# Patient Record
Sex: Female | Born: 1975
Health system: Southern US, Community
[De-identification: ages and names within clinical notes are randomized; demographics above are authoritative.]

## PROBLEM LIST (undated history)

## (undated) ENCOUNTER — Inpatient Hospital Stay (HOSPITAL_COMMUNITY): Payer: Self-pay

## (undated) DIAGNOSIS — N810 Urethrocele: Secondary | ICD-10-CM

## (undated) DIAGNOSIS — D649 Anemia, unspecified: Secondary | ICD-10-CM

## (undated) HISTORY — DX: Anemia, unspecified: D64.9

## (undated) HISTORY — DX: Urethrocele: N81.0

---

## 2007-04-17 ENCOUNTER — Inpatient Hospital Stay (HOSPITAL_COMMUNITY): Admission: AD | Admit: 2007-04-17 | Discharge: 2007-04-17 | Payer: Self-pay | Admitting: Obstetrics & Gynecology

## 2007-07-15 ENCOUNTER — Ambulatory Visit (HOSPITAL_COMMUNITY): Admission: RE | Admit: 2007-07-15 | Discharge: 2007-07-15 | Payer: Self-pay | Admitting: Obstetrics and Gynecology

## 2007-09-23 ENCOUNTER — Inpatient Hospital Stay (HOSPITAL_COMMUNITY): Admission: AD | Admit: 2007-09-23 | Discharge: 2007-09-23 | Payer: Self-pay | Admitting: Specialist

## 2007-10-26 ENCOUNTER — Inpatient Hospital Stay (HOSPITAL_COMMUNITY): Admission: AD | Admit: 2007-10-26 | Discharge: 2007-10-26 | Payer: Self-pay | Admitting: Obstetrics and Gynecology

## 2007-11-29 ENCOUNTER — Inpatient Hospital Stay (HOSPITAL_COMMUNITY): Admission: AD | Admit: 2007-11-29 | Discharge: 2007-12-01 | Payer: Self-pay | Admitting: Obstetrics and Gynecology

## 2008-05-26 ENCOUNTER — Emergency Department (HOSPITAL_COMMUNITY): Admission: EM | Admit: 2008-05-26 | Discharge: 2008-05-26 | Payer: Self-pay | Admitting: Emergency Medicine

## 2008-12-06 ENCOUNTER — Inpatient Hospital Stay (HOSPITAL_COMMUNITY): Admission: AD | Admit: 2008-12-06 | Discharge: 2008-12-06 | Payer: Self-pay | Admitting: Specialist

## 2008-12-18 ENCOUNTER — Inpatient Hospital Stay (HOSPITAL_COMMUNITY): Admission: AD | Admit: 2008-12-18 | Discharge: 2008-12-18 | Payer: Self-pay | Admitting: Obstetrics and Gynecology

## 2009-01-04 ENCOUNTER — Emergency Department (HOSPITAL_COMMUNITY): Admission: EM | Admit: 2009-01-04 | Discharge: 2009-01-04 | Payer: Self-pay | Admitting: Emergency Medicine

## 2009-04-21 ENCOUNTER — Inpatient Hospital Stay (HOSPITAL_COMMUNITY): Admission: AD | Admit: 2009-04-21 | Discharge: 2009-04-22 | Payer: Self-pay | Admitting: Obstetrics and Gynecology

## 2009-04-21 ENCOUNTER — Ambulatory Visit: Payer: Self-pay | Admitting: Physician Assistant

## 2009-06-29 ENCOUNTER — Inpatient Hospital Stay (HOSPITAL_COMMUNITY): Admission: AD | Admit: 2009-06-29 | Discharge: 2009-06-29 | Payer: Self-pay | Admitting: Obstetrics and Gynecology

## 2009-07-15 ENCOUNTER — Inpatient Hospital Stay (HOSPITAL_COMMUNITY): Admission: AD | Admit: 2009-07-15 | Discharge: 2009-07-18 | Payer: Self-pay | Admitting: Obstetrics and Gynecology

## 2009-07-21 ENCOUNTER — Inpatient Hospital Stay (HOSPITAL_COMMUNITY): Admission: AD | Admit: 2009-07-21 | Discharge: 2009-07-21 | Payer: Self-pay | Admitting: Obstetrics & Gynecology

## 2009-08-01 ENCOUNTER — Emergency Department (HOSPITAL_COMMUNITY): Admission: EM | Admit: 2009-08-01 | Discharge: 2009-08-01 | Payer: Self-pay | Admitting: Emergency Medicine

## 2009-10-09 ENCOUNTER — Emergency Department (HOSPITAL_BASED_OUTPATIENT_CLINIC_OR_DEPARTMENT_OTHER): Admission: EM | Admit: 2009-10-09 | Discharge: 2009-10-09 | Payer: Self-pay | Admitting: Emergency Medicine

## 2009-11-19 ENCOUNTER — Encounter: Admission: RE | Admit: 2009-11-19 | Discharge: 2009-11-19 | Payer: Self-pay | Admitting: Infectious Diseases

## 2010-06-16 LAB — CBC
HCT: 30.6 % — ABNORMAL LOW (ref 36.0–46.0)
MCHC: 31.7 g/dL (ref 30.0–36.0)
RBC: 4.18 MIL/uL (ref 3.87–5.11)
WBC: 9.6 10*3/uL (ref 4.0–10.5)

## 2010-06-16 LAB — STREP A DNA PROBE: Group A Strep Probe: NEGATIVE

## 2010-06-18 LAB — CBC
HCT: 31.2 % — ABNORMAL LOW (ref 36.0–46.0)
HCT: 33.1 % — ABNORMAL LOW (ref 36.0–46.0)
HCT: 33.4 % — ABNORMAL LOW (ref 36.0–46.0)
Hemoglobin: 10.5 g/dL — ABNORMAL LOW (ref 12.0–15.0)
Hemoglobin: 8.9 g/dL — ABNORMAL LOW (ref 12.0–15.0)
MCHC: 31.6 g/dL (ref 30.0–36.0)
MCHC: 31.6 g/dL (ref 30.0–36.0)
MCHC: 31.7 g/dL (ref 30.0–36.0)
MCHC: 31.8 g/dL (ref 30.0–36.0)
MCHC: 32.5 g/dL (ref 30.0–36.0)
MCV: 73.1 fL — ABNORMAL LOW (ref 78.0–100.0)
MCV: 73.3 fL — ABNORMAL LOW (ref 78.0–100.0)
MCV: 73.4 fL — ABNORMAL LOW (ref 78.0–100.0)
MCV: 73.8 fL — ABNORMAL LOW (ref 78.0–100.0)
MCV: 73.9 fL — ABNORMAL LOW (ref 78.0–100.0)
Platelets: 150 10*3/uL (ref 150–400)
Platelets: 87 10*3/uL — ABNORMAL LOW (ref 150–400)
Platelets: 89 10*3/uL — ABNORMAL LOW (ref 150–400)
RBC: 3.75 MIL/uL — ABNORMAL LOW (ref 3.87–5.11)
RBC: 4.22 MIL/uL (ref 3.87–5.11)
RDW: 13.5 % (ref 11.5–15.5)
RDW: 13.6 % (ref 11.5–15.5)
WBC: 14.2 10*3/uL — ABNORMAL HIGH (ref 4.0–10.5)
WBC: 8.5 10*3/uL (ref 4.0–10.5)
WBC: 9.9 10*3/uL (ref 4.0–10.5)

## 2010-06-18 LAB — COMPREHENSIVE METABOLIC PANEL
Alkaline Phosphatase: 102 U/L (ref 39–117)
Calcium: 8.9 mg/dL (ref 8.4–10.5)
Chloride: 109 mEq/L (ref 96–112)
Creatinine, Ser: 0.58 mg/dL (ref 0.4–1.2)
GFR calc non Af Amer: >60 mL/min (ref >60)
Potassium: 3.4 mEq/L — ABNORMAL LOW (ref 3.5–5.1)
Sodium: 140 mEq/L (ref 135–145)

## 2010-06-18 LAB — URINALYSIS, ROUTINE W REFLEX MICROSCOPIC
Ketones, ur: NEGATIVE mg/dL
Leukocytes, UA: NEGATIVE
Nitrite: NEGATIVE
Protein, ur: NEGATIVE mg/dL
Specific Gravity, Urine: <1.005 — ABNORMAL LOW (ref 1.005–1.030)
Urobilinogen, UA: 0.2 mg/dL (ref 0.0–1.0)

## 2010-06-18 LAB — URINE MICROSCOPIC-ADD ON

## 2010-06-18 LAB — URIC ACID: Uric Acid, Serum: 4.7 mg/dL (ref 2.4–7.0)

## 2010-07-05 LAB — COMPREHENSIVE METABOLIC PANEL
Albumin: 3.6 g/dL (ref 3.5–5.2)
BUN: 4 mg/dL — ABNORMAL LOW (ref 6–23)
CO2: 24 mEq/L (ref 19–32)
Chloride: 104 mEq/L (ref 96–112)
Creatinine, Ser: 0.62 mg/dL (ref 0.4–1.2)
GFR calc non Af Amer: >60 mL/min (ref >60)
Glucose, Bld: 99 mg/dL (ref 70–99)
Total Bilirubin: 0.5 mg/dL (ref 0.3–1.2)

## 2010-07-05 LAB — URINALYSIS, ROUTINE W REFLEX MICROSCOPIC
Glucose, UA: NEGATIVE mg/dL
Hgb urine dipstick: NEGATIVE
Urobilinogen, UA: 0.2 mg/dL (ref 0.0–1.0)
pH: 6.5 (ref 5.0–8.0)

## 2010-07-05 LAB — CBC
HCT: 36.4 % (ref 36.0–46.0)
MCV: 70 fL — ABNORMAL LOW (ref 78.0–100.0)
Platelets: 191 10*3/uL (ref 150–400)
WBC: 11.9 10*3/uL — ABNORMAL HIGH (ref 4.0–10.5)

## 2010-07-05 LAB — POCT PREGNANCY, URINE: Preg Test, Ur: POSITIVE

## 2010-07-05 LAB — URINE MICROSCOPIC-ADD ON

## 2010-07-16 LAB — RAPID STREP SCREEN (MED CTR MEBANE ONLY): Streptococcus, Group A Screen (Direct): POSITIVE — AB

## 2010-08-13 NOTE — H&P (Signed)
Stacy Olson, Stacy Olson            ACCOUNT NO.:  [id number]   MEDICAL RECORD NO.:  [id number]          PATIENT TYPE:  INP   LOCATION:  9175                          FACILITY:  [facility]   PHYSICIAN:  Duke Salvia. Marcelle Overlie, M.D.DATE OF BIRTH:  07-12-1975   DATE OF ADMISSION:  11/29/2007  DATE OF DISCHARGE:                              HISTORY & PHYSICAL   CHIEF COMPLAINT:  For induction of labor at term, audible deceleration  noted in the office.   HISTORY OF PRESENT ILLNESS:  A 35 year old G1, P0 was seen in the office  today with ultrasound showing AFI 27th percentile, EFW 7 pounds, 9  ounces, BPP 8/8, NST was reactive, but there was an audible variable  deceleration when was seen by Marcelino Duster L. Vincente Poli, M.D. who admits her  now for labor induction at term.   This patient's prenatal course has been eventful for positive IgA ACA  when she had developed a marginal abruption early in pregnancy that was  minimal and resolved on its own.  She has been followed by weekly NSTs  which have been reactive.  At one point she was scheduled for CS for  breech, but had spontaneous reversion.   GBS was negative, blood type is B+.  Rubella titer is immune.   PAST MEDICAL HISTORY:  Please see the [facility] form for the details of  her PMH.   PHYSICAL EXAM:  Temp 98.2, blood pressure 104/74.  HEENT: Unremarkable.  NECK:  Supple without mass.  LUNGS:  Clear.  CARDIOVASCULAR:  Regular rate and rhythm without murmurs, rubs, or  gallops.  BREASTS:  Not examined.  Term fundal height.  Fetal heart rate 140. Cervix is closed.  EXTREMITIES/NEUROLOGIC EXAM:  Unremarkable.   IMPRESSION:  1. Term intrauterine pregnancy, history of minimal abruption at [redacted]      weeks gestation with a weak positive IgA ACA.   PLAN:  Two-stage induction discussed with the patient.      Richard M. Marcelle Overlie, M.D.  Electronically Signed     RMH/MEDQ  D:  11/29/2007  T:  11/29/2007  Job:  [id number]

## 2010-08-13 NOTE — H&P (Signed)
NAMEJENIN, Stacy Olson            ACCOUNT NO.:  [id number]   MEDICAL RECORD NO.:  [id number]          PATIENT TYPE:  INP   LOCATION:  NA                            FACILITY:  [facility]   PHYSICIAN:  Dineen Kid. Rana Snare, M.D.    DATE OF BIRTH:  09-Mar-1976   DATE OF ADMISSION:  11/19/2007  DATE OF DISCHARGE:                              HISTORY & PHYSICAL   She is going to be admitted to [facility] on the morning of November 19, 2007 through Day Surgery.   HISTORY OF PRESENT ILLNESS:  Ms. Stacy Olson is a 35 year old G1 at 76 weeks'  gestational age who presents for a primary cesarean section.  Her  pregnancy has been complicated by partial abruption and also positive  IgA anticardiolipin antibody and breech presentation, also early labor  type of symptoms.  She presents for primary cesarean section due to  breech presentation.  Her GBS is negative.  EDC is November 29, 2007.   PAST MEDICAL HISTORY:  Negative.   PAST SURGICAL HISTORY:  Negative.   MEDICATIONS:  Prenatal vitamins.   She has no known drug allergies.   LABORATORY EVALUATION:  Her blood type is B+, HIV negative, hepatitis B  negative, VDRL  nonreactive.   PHYSICAL EXAM:  Her blood pressure is 108/70.  HEART:  Regular rate and rhythm.  LUNGS: Clear to auscultation bilaterally.  ABDOMEN:  Gravid, nontender.  Cervix is closed, thick and high, but soft.  Ultrasound confirms breech  presentation.  Normal AFI.  Biophysical 8/8.   IMPRESSION AND PLAN:  Intrauterine pregnancy at 39 weeks, breech  presentation, early labor type of symptoms with contractions.  Also,  positive anticardiolipin antibody and history of partial abruption early  this pregnancy at 22 weeks.  Recommend primary cesarean section.  The  risks and benefits of the procedure were discussed at length and  informed consent was obtained.      Dineen Kid Rana Snare, M.D.  Electronically Signed     DCL/MEDQ  D:  11/18/2007  T:  11/18/2007  Job:  [id number]

## 2010-10-15 ENCOUNTER — Encounter (HOSPITAL_COMMUNITY): Payer: Self-pay

## 2010-10-15 ENCOUNTER — Inpatient Hospital Stay (HOSPITAL_COMMUNITY)
Admission: AD | Admit: 2010-10-15 | Discharge: 2010-10-15 | Disposition: A | Discharge status: Home / Self Care | Payer: Self-pay | Source: Ambulatory Visit | Attending: Obstetrics & Gynecology | Admitting: Obstetrics & Gynecology

## 2010-10-15 DIAGNOSIS — R300 Dysuria: Secondary | ICD-10-CM | POA: Insufficient documentation

## 2010-10-15 DIAGNOSIS — N390 Urinary tract infection, site not specified: Secondary | ICD-10-CM | POA: Insufficient documentation

## 2010-10-15 DIAGNOSIS — N810 Urethrocele: Secondary | ICD-10-CM

## 2010-10-15 LAB — URINALYSIS, ROUTINE W REFLEX MICROSCOPIC
Leukocytes, UA: NEGATIVE
Nitrite: POSITIVE — AB
Protein, ur: NEGATIVE mg/dL
Urobilinogen, UA: 0.2 mg/dL (ref 0.0–1.0)

## 2010-10-15 LAB — URINE MICROSCOPIC-ADD ON

## 2010-10-15 MED ORDER — HYDROCODONE-ACETAMINOPHEN 5-500 MG PO TABS: 1.0000 | ORAL_TABLET | Freq: Four times a day (QID) | ORAL | Status: AC | PRN

## 2010-10-15 MED ORDER — CIPROFLOXACIN HCL 500 MG PO TABS: 500.0000 mg | ORAL_TABLET | Freq: Two times a day (BID) | ORAL | Status: AC

## 2010-10-15 NOTE — Progress Notes (Signed)
urethracyle noted in vaginal vault. Explained to pt by E.Rice,PA

## 2010-10-15 NOTE — Progress Notes (Signed)
Pt states she has had vagina pain since Sunday. No bleeding or d/c

## 2010-10-15 NOTE — ED Provider Notes (Signed)
History    Patient is a 35 year old black female gravida 2 para 2. She presents today complaining of pain with urination. She states the pain started on Friday of last week. She also thinks she can feel any mass. She denies fever, vaginal discharge, vaginal bleeding, or abdominal pain. She has other questions or problems at this time.  Chief Complaint  Patient presents with  . Vaginal Pain   HPI  OB History    Grav Para Term Preterm Abortions TAB SAB Ect Mult Living   2 2 2       2       No past medical history on file.  Past Surgical History  Procedure Date  . No past surgeries     No family history on file.  History  Substance Use Topics  . Smoking status: Never Smoker   . Smokeless tobacco: Not on file  . Alcohol Use: No    Allergies: No Known Allergies  No prescriptions prior to admission    Review of Systems  Constitutional: Negative for fever and chills.  Cardiovascular: Negative for chest pain and palpitations.  Gastrointestinal: Positive for nausea. Negative for vomiting, abdominal pain, diarrhea and constipation.  Genitourinary: Positive for dysuria. Negative for urgency, frequency, hematuria and flank pain.  Neurological: Negative for headaches.  Psychiatric/Behavioral: Negative for depression and suicidal ideas.   Physical Exam   Blood pressure 110/66, pulse 84, temperature 98.7 F (37.1 C), temperature source Oral, resp. rate 16, height 5\' 6"  (1.676 m), weight 168 lb 6.4 oz (76.386 kg), last menstrual period 09/29/2010, SpO2 99.00%.  Physical Exam  Constitutional: She appears well-developed and well-nourished. No distress.  HENT:  Head: Normocephalic and atraumatic.  Eyes: EOM are normal. Pupils are equal, round, and reactive to light.  GI: She exhibits no distension. There is no tenderness. There is no rebound and no guarding.  Genitourinary:     Skin: She is not diaphoretic.    MAU Course  Procedures  Results for orders placed during the  hospital encounter of 10/15/10 (from the past 24 hour(s))  URINALYSIS, ROUTINE W REFLEX MICROSCOPIC     Status: Abnormal   Collection Time   10/15/10 10:48 AM      Component Value Range   Color, Urine YELLOW  YELLOW    Appearance CLEAR  CLEAR    Specific Gravity, Urine 1.025  1.005 - 1.030    pH 6.0  5.0 - 8.0    Glucose, UA NEGATIVE  NEGATIVE (mg/dL)   Hgb urine dipstick TRACE (*) NEGATIVE    Bilirubin Urine NEGATIVE  NEGATIVE    Ketones, ur NEGATIVE  NEGATIVE (mg/dL)   Protein, ur NEGATIVE  NEGATIVE (mg/dL)   Urobilinogen, UA 0.2  0.0 - 1.0 (mg/dL)   Nitrite POSITIVE (*) NEGATIVE    Leukocytes, UA NEGATIVE  NEGATIVE   URINE MICROSCOPIC-ADD ON     Status: Abnormal   Collection Time   10/15/10 10:48 AM      Component Value Range   Squamous Epithelial / LPF MANY (*) RARE    RBC / HPF 3-6  <3 (RBC/hpf)   Bacteria, UA MANY (*) RARE      Assessment and Plan  1) Urethral mass/UTI: urine sent for culture. I did discuss this pt with Dr. Isabel Caprice (urologist). He advised that she be started on antibiotics and f/u with Dr. Jacquelyne Balint in his office. She will be given an Rx for Cipro and lortab. Discussed diet, activity, risks, and precautions. She will f/u with urology  in the morning.  Clinton Gallant. Rice III, DrHSc, MPAS, PA-C  10/15/2010, 11:13 AM

## 2010-10-17 LAB — URINE CULTURE: Colony Count: >=100000

## 2010-12-11 ENCOUNTER — Other Ambulatory Visit (HOSPITAL_COMMUNITY): Payer: Self-pay | Admitting: Urology

## 2010-12-11 DIAGNOSIS — N361 Urethral diverticulum: Secondary | ICD-10-CM

## 2010-12-16 ENCOUNTER — Ambulatory Visit (HOSPITAL_COMMUNITY)
Admission: RE | Admit: 2010-12-16 | Discharge: 2010-12-16 | Disposition: A | Discharge status: Home / Self Care | Payer: 59 | Source: Ambulatory Visit | Attending: Urology | Admitting: Urology

## 2010-12-16 DIAGNOSIS — N2889 Other specified disorders of kidney and ureter: Secondary | ICD-10-CM | POA: Insufficient documentation

## 2010-12-16 DIAGNOSIS — N361 Urethral diverticulum: Secondary | ICD-10-CM

## 2010-12-16 DIAGNOSIS — N831 Corpus luteum cyst of ovary, unspecified side: Secondary | ICD-10-CM | POA: Insufficient documentation

## 2010-12-16 MED ORDER — GADOBENATE DIMEGLUMINE 529 MG/ML IV SOLN
15.0000 mL | Freq: Once | INTRAVENOUS | Status: AC | PRN
  Administered 2010-12-16: 15 mL via INTRAVENOUS

## 2010-12-20 LAB — URINALYSIS, ROUTINE W REFLEX MICROSCOPIC
Ketones, ur: NEGATIVE
Protein, ur: NEGATIVE
Urobilinogen, UA: 0.2

## 2010-12-20 LAB — CBC
HCT: 30.5 — ABNORMAL LOW
Hemoglobin: 9.9 — ABNORMAL LOW
Platelets: 175
WBC: 6.9

## 2010-12-20 LAB — WET PREP, GENITAL
Clue Cells Wet Prep HPF POC: NONE SEEN
Yeast Wet Prep HPF POC: NONE SEEN

## 2010-12-20 LAB — POCT PREGNANCY, URINE: Operator id: 28886

## 2010-12-26 LAB — COMPREHENSIVE METABOLIC PANEL
ALT: 25
Alkaline Phosphatase: 79
BUN: 4 — ABNORMAL LOW
CO2: 22
Chloride: 105
GFR calc non Af Amer: >60
Glucose, Bld: 93
Potassium: 3.2 — ABNORMAL LOW
Sodium: 134 — ABNORMAL LOW
Total Bilirubin: 0.4
Total Protein: 6.3

## 2010-12-26 LAB — CBC
HCT: 31.8 — ABNORMAL LOW
Hemoglobin: 10.2 — ABNORMAL LOW
RBC: 4.5
RDW: 15.1

## 2010-12-26 LAB — URIC ACID: Uric Acid, Serum: 3.4

## 2010-12-26 LAB — LACTATE DEHYDROGENASE: LDH: 101

## 2011-01-01 LAB — CBC
Hemoglobin: 7.5 — CL
MCHC: 31.5
MCHC: 31.8
Platelets: 86 — ABNORMAL LOW
RBC: 3.41 — ABNORMAL LOW
RDW: 15.2
WBC: 20.7 — ABNORMAL HIGH

## 2011-04-08 ENCOUNTER — Encounter (HOSPITAL_COMMUNITY): Admission: RE | Payer: Self-pay | Source: Ambulatory Visit

## 2011-04-08 ENCOUNTER — Ambulatory Visit (HOSPITAL_COMMUNITY): Admission: RE | Admit: 2011-04-08 | Payer: Managed Care, Other (non HMO) | Source: Ambulatory Visit | Admitting: Urology

## 2011-04-08 SURGERY — URETHROPLASTY, FOR DIVERTICULUM
Anesthesia: General
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
## 2011-05-12 ENCOUNTER — Encounter (HOSPITAL_COMMUNITY): Payer: Self-pay | Admitting: *Deleted

## 2011-05-12 ENCOUNTER — Inpatient Hospital Stay (HOSPITAL_COMMUNITY)
Admission: AD | Admit: 2011-05-12 | Discharge: 2011-05-12 | Disposition: A | Discharge status: Home / Self Care | Payer: Medicaid Other | Source: Ambulatory Visit | Attending: Obstetrics and Gynecology | Admitting: Obstetrics and Gynecology

## 2011-05-12 DIAGNOSIS — O219 Vomiting of pregnancy, unspecified: Secondary | ICD-10-CM

## 2011-05-12 DIAGNOSIS — O210 Mild hyperemesis gravidarum: Secondary | ICD-10-CM | POA: Insufficient documentation

## 2011-05-12 LAB — URINE MICROSCOPIC-ADD ON

## 2011-05-12 LAB — URINALYSIS, ROUTINE W REFLEX MICROSCOPIC
Bilirubin Urine: NEGATIVE
Glucose, UA: NEGATIVE mg/dL
Ketones, ur: 15 mg/dL — AB
Leukocytes, UA: NEGATIVE
pH: 5.5 (ref 5.0–8.0)

## 2011-05-12 MED ORDER — SODIUM CHLORIDE 0.9 % IJ SOLN
INTRAMUSCULAR | Status: AC
  Filled 2011-05-12: qty 3

## 2011-05-12 MED ORDER — PRENATAL RX 60-1 MG PO TABS: 1.0000 | ORAL_TABLET | Freq: Every day | ORAL | Status: DC

## 2011-05-12 MED ORDER — LACTATED RINGERS IV SOLN
Freq: Once | INTRAVENOUS | Status: AC
  Administered 2011-05-12: 03:00:00 via INTRAVENOUS

## 2011-05-12 MED ORDER — SODIUM CHLORIDE 0.9 % IV SOLN
25.0000 mg | Freq: Once | INTRAVENOUS | Status: AC
  Administered 2011-05-12: 25 mg via INTRAVENOUS
  Filled 2011-05-12: qty 1

## 2011-05-12 MED ORDER — ONDANSETRON 8 MG PO TBDP: 8.0000 mg | ORAL_TABLET | Freq: Three times a day (TID) | ORAL | Status: AC | PRN

## 2011-05-12 NOTE — Progress Notes (Signed)
Pt states for the last 7 days she has had a lot of nausea and vomiting-sttes it is related to the pregnancy

## 2011-05-12 NOTE — Progress Notes (Signed)
Pt c/o numerous episodes vomiting x 2 days. No diarrhea. Mild epigastric pain.

## 2011-05-12 NOTE — Progress Notes (Signed)
Pt able to eat crackers and clear liquids without vomiting.

## 2011-05-12 NOTE — ED Provider Notes (Signed)
History     Chief Complaint  Patient presents with  . Morning Sickness   HPI This is a 36 y.o. female who presents with complaint of "morning sickness" which has worsened over the past two days. Was able to keep down Coke until today. Has seen Dr Arelia Sneddon in past pregnancies, but has not called them yet for this pregnancy because she did not have her Medicaid yet. Denies fever or other symptoms except a bit of epigastric pain.   OB History    Grav Para Term Preterm Abortions TAB SAB Ect Mult Living   3 2 2       2       History reviewed. No pertinent past medical history.  Past Surgical History  Procedure Date  . No past surgeries     History reviewed. No pertinent family history.  History  Substance Use Topics  . Smoking status: Never Smoker   . Smokeless tobacco: Not on file  . Alcohol Use: No    Allergies: No Known Allergies  No prescriptions prior to admission    ROS As above  Physical Exam   Blood pressure 121/71, pulse 83, temperature 97.8 F (36.6 C), temperature source Oral, resp. rate 20, height 5\' 6"  (1.676 m), weight 166 lb (75.297 kg), last menstrual period 03/01/2011, SpO2 100.00%.  Physical Exam  Constitutional: She is oriented to person, place, and time. She appears well-developed. No distress.  HENT:  Head: Normocephalic.  Cardiovascular: Normal rate.   Respiratory: Effort normal.  GI: Soft. She exhibits no distension and no mass. There is no tenderness. There is no rebound and no guarding.  Musculoskeletal: Normal range of motion.  Neurological: She is alert and oriented to person, place, and time.  Skin: Skin is warm and dry.  Psychiatric: She has a normal mood and affect.    MAU Course  Procedures  Assessment and Plan  A:  Pregnancy at 10.2 weeks by LMP      Nausea and vomiting of pregnancy P:  Phenergan via IV infusion for one liter  [facility] 05/12/2011, 1:14 AM   Feels better after second liter of fluid Will d/c home with  Zofran. Pt to follow up with [facility]

## 2011-05-30 ENCOUNTER — Encounter (HOSPITAL_COMMUNITY): Payer: Self-pay | Admitting: *Deleted

## 2011-05-30 ENCOUNTER — Inpatient Hospital Stay (HOSPITAL_COMMUNITY)
Admission: AD | Admit: 2011-05-30 | Discharge: 2011-05-31 | Disposition: A | Discharge status: Home / Self Care | Payer: Managed Care, Other (non HMO) | Source: Ambulatory Visit | Attending: Obstetrics and Gynecology | Admitting: Obstetrics and Gynecology

## 2011-05-30 DIAGNOSIS — O219 Vomiting of pregnancy, unspecified: Secondary | ICD-10-CM

## 2011-05-30 DIAGNOSIS — R1084 Generalized abdominal pain: Secondary | ICD-10-CM

## 2011-05-30 DIAGNOSIS — O210 Mild hyperemesis gravidarum: Secondary | ICD-10-CM | POA: Insufficient documentation

## 2011-05-30 LAB — URINALYSIS, ROUTINE W REFLEX MICROSCOPIC
Nitrite: NEGATIVE
Protein, ur: NEGATIVE mg/dL
Urobilinogen, UA: 0.2 mg/dL (ref 0.0–1.0)

## 2011-05-30 LAB — URINE MICROSCOPIC-ADD ON

## 2011-05-30 MED ORDER — LACTATED RINGERS IV BOLUS (SEPSIS)
1000.0000 mL | Freq: Once | INTRAVENOUS | Status: AC
  Administered 2011-05-31: 1000 mL via INTRAVENOUS

## 2011-05-30 MED ORDER — PROMETHAZINE HCL 25 MG/ML IJ SOLN
12.5000 mg | Freq: Once | INTRAMUSCULAR | Status: AC
  Administered 2011-05-31: 12.5 mg via INTRAVENOUS
  Filled 2011-05-30: qty 1

## 2011-05-30 NOTE — ED Provider Notes (Signed)
History     Chief Complaint  Patient presents with  . Morning Sickness  . Abdominal Pain   HPI 36 y.o. G3P2002 at [redacted]w[redacted]d with n/v since onset of pregnancy, taking Zofran, not helping, not able to keep anything down x 2 days. Also c/o low abd pain and low back pain x 4-5 days.    History reviewed. No pertinent past medical history.  Past Surgical History  Procedure Date  . No past surgeries     Family History  Problem Relation Age of Onset  . Anesthesia problems Neg Hx   . Hypotension Neg Hx   . Malignant hyperthermia Neg Hx   . Pseudochol deficiency Neg Hx     History  Substance Use Topics  . Smoking status: Never Smoker   . Smokeless tobacco: Not on file  . Alcohol Use: No    Allergies: No Known Allergies  Prescriptions prior to admission  Medication Sig Dispense Refill  . ferrous sulfate 325 (65 FE) MG tablet Take 325 mg by mouth as needed. Patient states that she takes iron tablet as a supplement.      . ondansetron (ZOFRAN-ODT) 4 MG disintegrating tablet Take 4 mg by mouth every 8 (eight) hours as needed. For nausea      . Prenatal Vit-Fe Fumarate-FA (PRENATAL MULTIVITAMIN) 60-1 MG tablet Take 1 tablet by mouth daily.  30 tablet  0    Review of Systems  Constitutional: Negative.   Respiratory: Negative.   Cardiovascular: Negative.   Gastrointestinal: Positive for nausea, vomiting and abdominal pain. Negative for diarrhea and constipation.  Genitourinary: Negative for dysuria, urgency, frequency, hematuria and flank pain.       Negative for vaginal bleeding, + vaginal discharge  Musculoskeletal: Negative.   Neurological: Negative.   Psychiatric/Behavioral: Negative.    Physical Exam   Blood pressure 99/77, pulse 95, temperature 98.9 F (37.2 C), temperature source Oral, resp. rate 16, height 5\' 6"  (1.676 m), weight 165 lb 8 oz (75.07 kg), last menstrual period 03/19/2011.  Physical Exam  Nursing note and vitals reviewed. Constitutional: She is oriented  to person, place, and time. She appears well-developed and well-nourished. No distress.  Cardiovascular: Normal rate.   Respiratory: Effort normal.  GI: Soft. She exhibits no distension and no mass. There is no tenderness. There is no rebound and no guarding.  Musculoskeletal: Normal range of motion.  Neurological: She is alert and oriented to person, place, and time.  Skin: Skin is warm and dry.  Psychiatric: She has a normal mood and affect.    MAU Course  Procedures  Beside u/s for viability only: + FHR, 150  IV hydration and Phenergan IV - feeling better, tolerating PO fluids and peanut butter crackers  Assessment and Plan  35 y.o. [id number] at [redacted]w[redacted]d N/V of pregnancy - rx phenergan 25 mg PO q 4-6 hours PRN F/U as scheduled  FRAZIER,NATALIE 05/31/2011, 12:07 AM

## 2011-05-30 NOTE — Progress Notes (Signed)
Georges Mouse CNM in with pt. Portable u/s brought in and fetus active with fhr about 150.

## 2011-05-30 NOTE — Progress Notes (Signed)
Pt states, " I have been vomiting since yesterday and I've taken zofran but it is not helping. I also have pain in my lower belly since yesterday."

## 2011-05-31 MED ORDER — PROMETHAZINE HCL 25 MG PO TABS: 25.0000 mg | ORAL_TABLET | Freq: Four times a day (QID) | ORAL | Status: DC | PRN

## 2011-05-31 NOTE — Progress Notes (Signed)
Written and verbal d/c instructions given and understanding voiced. 

## 2011-06-05 LAB — OB RESULTS CONSOLE HEPATITIS B SURFACE ANTIGEN: Hepatitis B Surface Ag: NEGATIVE

## 2011-06-05 LAB — OB RESULTS CONSOLE ABO/RH: RH Type: POSITIVE

## 2011-06-24 ENCOUNTER — Other Ambulatory Visit: Payer: Self-pay

## 2011-07-15 ENCOUNTER — Other Ambulatory Visit: Payer: Self-pay

## 2011-07-16 ENCOUNTER — Other Ambulatory Visit (HOSPITAL_COMMUNITY): Payer: Self-pay | Admitting: Obstetrics and Gynecology

## 2011-07-16 DIAGNOSIS — O359XX0 Maternal care for (suspected) fetal abnormality and damage, unspecified, not applicable or unspecified: Secondary | ICD-10-CM

## 2011-07-18 ENCOUNTER — Ambulatory Visit (HOSPITAL_COMMUNITY): Payer: Managed Care, Other (non HMO)

## 2011-07-18 ENCOUNTER — Ambulatory Visit (HOSPITAL_COMMUNITY)
Admission: RE | Admit: 2011-07-18 | Discharge: 2011-07-18 | Disposition: A | Discharge status: Home / Self Care | Payer: Medicaid Other | Source: Ambulatory Visit | Attending: Obstetrics and Gynecology | Admitting: Obstetrics and Gynecology

## 2011-07-18 ENCOUNTER — Encounter (HOSPITAL_COMMUNITY): Payer: Self-pay

## 2011-07-18 VITALS — BP 98/66 | HR 97 | Wt 167.0 lb

## 2011-07-18 DIAGNOSIS — O358XX0 Maternal care for other (suspected) fetal abnormality and damage, not applicable or unspecified: Secondary | ICD-10-CM | POA: Insufficient documentation

## 2011-07-18 DIAGNOSIS — O09529 Supervision of elderly multigravida, unspecified trimester: Secondary | ICD-10-CM | POA: Insufficient documentation

## 2011-07-18 DIAGNOSIS — O359XX0 Maternal care for (suspected) fetal abnormality and damage, unspecified, not applicable or unspecified: Secondary | ICD-10-CM

## 2011-07-18 DIAGNOSIS — Z1389 Encounter for screening for other disorder: Secondary | ICD-10-CM | POA: Insufficient documentation

## 2011-07-18 DIAGNOSIS — Z363 Encounter for antenatal screening for malformations: Secondary | ICD-10-CM | POA: Insufficient documentation

## 2011-07-23 ENCOUNTER — Encounter (HOSPITAL_COMMUNITY): Payer: Self-pay | Admitting: *Deleted

## 2011-07-23 ENCOUNTER — Inpatient Hospital Stay (HOSPITAL_COMMUNITY)
Admission: AD | Admit: 2011-07-23 | Discharge: 2011-07-23 | Disposition: A | Discharge status: Home / Self Care | Payer: Medicaid Other | Source: Ambulatory Visit | Attending: Obstetrics and Gynecology | Admitting: Obstetrics and Gynecology

## 2011-07-23 DIAGNOSIS — N949 Unspecified condition associated with female genital organs and menstrual cycle: Secondary | ICD-10-CM

## 2011-07-23 DIAGNOSIS — R109 Unspecified abdominal pain: Secondary | ICD-10-CM | POA: Insufficient documentation

## 2011-07-23 DIAGNOSIS — O99891 Other specified diseases and conditions complicating pregnancy: Secondary | ICD-10-CM | POA: Insufficient documentation

## 2011-07-23 LAB — URINALYSIS, ROUTINE W REFLEX MICROSCOPIC
Bilirubin Urine: NEGATIVE
Leukocytes, UA: NEGATIVE
Nitrite: NEGATIVE
Specific Gravity, Urine: 1.02 (ref 1.005–1.030)
pH: 6 (ref 5.0–8.0)

## 2011-07-23 NOTE — MAU Note (Signed)
N. Frazier, CNM at bedside.  Assessment done and poc discussed with pt.  

## 2011-07-23 NOTE — MAU Note (Signed)
Pt sttes she had cramping tht strted yesteday morning ay 0600-has gotten worse and worse

## 2011-07-23 NOTE — MAU Provider Note (Signed)
History     CSN: [id number]  Arrival date and time: 07/23/11 2130   First Provider Initiated Contact with Patient 07/23/11 [phone]      Chief Complaint  Patient presents with  . Abdominal Pain   HPI 36 y.o. G3P2002 at [redacted]w[redacted]d with bilateral low abd pain starting yesterday morning, no pain during the day, pain resumed around 6 PM, worse at work while ambulating/lifting, better at rest. No discharge or bleeding.    History reviewed. No pertinent past medical history.  Past Surgical History  Procedure Date  . No past surgeries     Family History  Problem Relation Age of Onset  . Anesthesia problems Neg Hx   . Hypotension Neg Hx   . Malignant hyperthermia Neg Hx   . Pseudochol deficiency Neg Hx     History  Substance Use Topics  . Smoking status: Never Smoker   . Smokeless tobacco: Not on file  . Alcohol Use: No    Allergies: No Known Allergies  Prescriptions prior to admission  Medication Sig Dispense Refill  . ferrous sulfate 325 (65 FE) MG tablet Take 325 mg by mouth as needed. Patient states that she takes iron tablet as a supplement.      . ondansetron (ZOFRAN-ODT) 4 MG disintegrating tablet Take 4 mg by mouth every 8 (eight) hours as needed. For nausea      . Prenatal Vit-Fe Fumarate-FA (PRENATAL MULTIVITAMIN) 60-1 MG tablet Take 1 tablet by mouth daily.  30 tablet  0  . promethazine (PHENERGAN) 25 MG tablet Take 1 tablet (25 mg total) by mouth every 6 (six) hours as needed for nausea.  60 tablet  0    Review of Systems  Constitutional: Negative.   Respiratory: Negative.   Cardiovascular: Negative.   Gastrointestinal: Positive for abdominal pain. Negative for nausea, vomiting, diarrhea and constipation.  Genitourinary: Negative for dysuria, urgency, frequency, hematuria and flank pain.       Negative for vaginal bleeding, vaginal discharge  Musculoskeletal: Negative.   Neurological: Negative.   Psychiatric/Behavioral: Negative.    Physical Exam   Blood  pressure 117/58, pulse 84, temperature 98.2 F (36.8 C), temperature source Oral, resp. rate 20, height 5\' 6"  (1.676 m), weight 167 lb (75.751 kg), last menstrual period 03/19/2011, SpO2 100.00%.  Physical Exam  Nursing note and vitals reviewed. Constitutional: She is oriented to person, place, and time. She appears well-developed and well-nourished. No distress.  Cardiovascular: Normal rate.   Respiratory: Effort normal.  GI: Soft. She exhibits no mass. There is no tenderness. There is no rebound and no guarding.  Genitourinary:       SVE: closed/thick/high  Musculoskeletal: Normal range of motion.  Neurological: She is alert and oriented to person, place, and time.  Skin: Skin is warm and dry.  Psychiatric: She has a normal mood and affect.    MAU Course  Procedures  Results for orders placed during the hospital encounter of 07/23/11 (from the past 24 hour(s))  URINALYSIS, ROUTINE W REFLEX MICROSCOPIC     Status: Normal   Collection Time   07/23/11  4:20 AM      Component Value Range   Color, Urine YELLOW  YELLOW    APPearance CLEAR  CLEAR    Specific Gravity, Urine 1.020  1.005 - 1.030    pH 6.0  5.0 - 8.0    Glucose, UA NEGATIVE  NEGATIVE (mg/dL)   Hgb urine dipstick NEGATIVE  NEGATIVE    Bilirubin Urine NEGATIVE  NEGATIVE  Ketones, ur NEGATIVE  NEGATIVE (mg/dL)   Protein, ur NEGATIVE  NEGATIVE (mg/dL)   Urobilinogen, UA 1.0  0.0 - 1.0 (mg/dL)   Nitrite NEGATIVE  NEGATIVE    Leukocytes, UA NEGATIVE  NEGATIVE      Assessment and Plan  36 y.o. [id number] at [redacted]w[redacted]d Round ligament pain F/u as scheduled  Milagro Belmares 07/23/2011, 5:01 AM

## 2011-08-08 ENCOUNTER — Ambulatory Visit (HOSPITAL_COMMUNITY)
Admission: RE | Admit: 2011-08-08 | Discharge: 2011-08-08 | Disposition: A | Discharge status: Home / Self Care | Payer: Medicaid Other | Source: Ambulatory Visit | Attending: Obstetrics and Gynecology | Admitting: Obstetrics and Gynecology

## 2011-08-08 VITALS — BP 102/65 | HR 108 | Wt 171.0 lb

## 2011-08-08 DIAGNOSIS — O359XX0 Maternal care for (suspected) fetal abnormality and damage, unspecified, not applicable or unspecified: Secondary | ICD-10-CM

## 2011-08-08 DIAGNOSIS — O358XX0 Maternal care for other (suspected) fetal abnormality and damage, not applicable or unspecified: Secondary | ICD-10-CM | POA: Insufficient documentation

## 2011-08-08 DIAGNOSIS — O09529 Supervision of elderly multigravida, unspecified trimester: Secondary | ICD-10-CM | POA: Insufficient documentation

## 2011-08-08 NOTE — Progress Notes (Signed)
Patient seen today  for follow up ultrasound.  See full report in [facility].  Alpha Gula, MD  IUP at 21+1 weeks A small amount of fluid in the intrahemispheric space is again noted- unchanged in size since prior study;  a definitive cystic structure not visualized today; there no mass affect, no vascular flow on power Doppler. Interval growth is appropriate. Normal amniotic fluid volume Several small uterine fibroids are again noted.  Recommend follow up in 4 weeks to reevaluate.  If any change in size or character noted of the intracranial anatomy, would recommend fetal MRI.

## 2011-09-05 ENCOUNTER — Ambulatory Visit (HOSPITAL_COMMUNITY): Payer: Medicaid Other | Attending: Obstetrics and Gynecology

## 2011-09-25 ENCOUNTER — Other Ambulatory Visit (HOSPITAL_COMMUNITY): Payer: Self-pay | Admitting: Maternal and Fetal Medicine

## 2011-09-25 DIAGNOSIS — O359XX0 Maternal care for (suspected) fetal abnormality and damage, unspecified, not applicable or unspecified: Secondary | ICD-10-CM

## 2011-10-07 ENCOUNTER — Ambulatory Visit (HOSPITAL_COMMUNITY): Payer: Medicaid Other

## 2011-10-09 ENCOUNTER — Encounter (HOSPITAL_COMMUNITY): Payer: Self-pay

## 2011-10-09 ENCOUNTER — Ambulatory Visit (HOSPITAL_COMMUNITY)
Admission: RE | Admit: 2011-10-09 | Discharge: 2011-10-09 | Disposition: A | Discharge status: Home / Self Care | Payer: Medicaid Other | Source: Ambulatory Visit | Attending: Obstetrics and Gynecology | Admitting: Obstetrics and Gynecology

## 2011-10-09 VITALS — BP 98/58 | HR 106 | Wt 164.5 lb

## 2011-10-09 DIAGNOSIS — O09529 Supervision of elderly multigravida, unspecified trimester: Secondary | ICD-10-CM | POA: Insufficient documentation

## 2011-10-09 DIAGNOSIS — O359XX0 Maternal care for (suspected) fetal abnormality and damage, unspecified, not applicable or unspecified: Secondary | ICD-10-CM

## 2011-10-09 DIAGNOSIS — O358XX0 Maternal care for other (suspected) fetal abnormality and damage, not applicable or unspecified: Secondary | ICD-10-CM | POA: Insufficient documentation

## 2011-10-09 NOTE — Progress Notes (Signed)
Patient seen today  for follow up ultrasound.  See full report in [facility].  Alpha Gula, MD  IUP at 30 0/7 weeks The small amount of fluid in the intrahemispheric space that was previously noted appears to have resolved. Interval growth is appropriate (72nd % tile) Normal amniotic fluid volume  Will reevaluate in 4 weeks - if not appreciated at that time, no further follow up required.

## 2011-10-19 ENCOUNTER — Inpatient Hospital Stay (HOSPITAL_COMMUNITY)
Admission: AD | Admit: 2011-10-19 | Discharge: 2011-10-20 | Disposition: A | Discharge status: Home / Self Care | Payer: Medicaid Other | Source: Ambulatory Visit | Attending: Obstetrics and Gynecology | Admitting: Obstetrics and Gynecology

## 2011-10-19 DIAGNOSIS — R109 Unspecified abdominal pain: Secondary | ICD-10-CM | POA: Insufficient documentation

## 2011-10-19 DIAGNOSIS — O99891 Other specified diseases and conditions complicating pregnancy: Secondary | ICD-10-CM | POA: Insufficient documentation

## 2011-11-06 ENCOUNTER — Ambulatory Visit (HOSPITAL_COMMUNITY): Payer: Medicaid Other | Attending: Obstetrics and Gynecology

## 2011-11-11 ENCOUNTER — Other Ambulatory Visit (HOSPITAL_COMMUNITY): Payer: Self-pay | Admitting: Maternal and Fetal Medicine

## 2011-11-11 DIAGNOSIS — O359XX0 Maternal care for (suspected) fetal abnormality and damage, unspecified, not applicable or unspecified: Secondary | ICD-10-CM

## 2011-11-14 ENCOUNTER — Ambulatory Visit (HOSPITAL_COMMUNITY)
Admission: RE | Admit: 2011-11-14 | Discharge: 2011-11-14 | Disposition: A | Discharge status: Home / Self Care | Payer: Medicaid Other | Source: Ambulatory Visit | Attending: Obstetrics and Gynecology | Admitting: Obstetrics and Gynecology

## 2011-11-14 DIAGNOSIS — O09529 Supervision of elderly multigravida, unspecified trimester: Secondary | ICD-10-CM | POA: Insufficient documentation

## 2011-11-14 DIAGNOSIS — O359XX0 Maternal care for (suspected) fetal abnormality and damage, unspecified, not applicable or unspecified: Secondary | ICD-10-CM

## 2011-11-14 DIAGNOSIS — O358XX0 Maternal care for other (suspected) fetal abnormality and damage, not applicable or unspecified: Secondary | ICD-10-CM | POA: Insufficient documentation

## 2011-12-03 ENCOUNTER — Encounter (HOSPITAL_COMMUNITY): Payer: Self-pay | Admitting: Pharmacist

## 2011-12-10 ENCOUNTER — Encounter (HOSPITAL_COMMUNITY)
Admission: AD | Disposition: A | Discharge status: Home / Self Care | Payer: Self-pay | Source: Ambulatory Visit | Attending: Obstetrics and Gynecology

## 2011-12-10 ENCOUNTER — Inpatient Hospital Stay (HOSPITAL_COMMUNITY): Payer: Medicaid Other | Admitting: Anesthesiology

## 2011-12-10 ENCOUNTER — Encounter (HOSPITAL_COMMUNITY): Payer: Self-pay | Admitting: *Deleted

## 2011-12-10 ENCOUNTER — Encounter (HOSPITAL_COMMUNITY): Payer: Self-pay | Admitting: Anesthesiology

## 2011-12-10 ENCOUNTER — Inpatient Hospital Stay (HOSPITAL_COMMUNITY)
Admission: AD | Admit: 2011-12-10 | Discharge: 2011-12-14 | DRG: 766 | Disposition: A | Discharge status: Home / Self Care | Payer: Medicaid Other | Source: Ambulatory Visit | Attending: Obstetrics and Gynecology | Admitting: Obstetrics and Gynecology

## 2011-12-10 DIAGNOSIS — O3660X0 Maternal care for excessive fetal growth, unspecified trimester, not applicable or unspecified: Secondary | ICD-10-CM | POA: Diagnosis present

## 2011-12-10 DIAGNOSIS — O358XX0 Maternal care for other (suspected) fetal abnormality and damage, not applicable or unspecified: Secondary | ICD-10-CM | POA: Diagnosis present

## 2011-12-10 DIAGNOSIS — O09529 Supervision of elderly multigravida, unspecified trimester: Secondary | ICD-10-CM | POA: Diagnosis present

## 2011-12-10 DIAGNOSIS — O322XX0 Maternal care for transverse and oblique lie, not applicable or unspecified: Principal | ICD-10-CM | POA: Diagnosis present

## 2011-12-10 LAB — CBC
Hemoglobin: 9.7 g/dL — ABNORMAL LOW (ref 12.0–15.0)
MCH: 21.6 pg — ABNORMAL LOW (ref 26.0–34.0)
MCHC: 32.1 g/dL (ref 30.0–36.0)
MCV: 67.1 fL — ABNORMAL LOW (ref 78.0–100.0)
RBC: 4.5 MIL/uL (ref 3.87–5.11)

## 2011-12-10 SURGERY — Surgical Case
Anesthesia: Spinal | Wound class: Clean Contaminated

## 2011-12-10 MED ORDER — LACTATED RINGERS IV SOLN
INTRAVENOUS | Status: DC
  Administered 2011-12-10 – 2011-12-11 (×2): via INTRAVENOUS

## 2011-12-10 MED ORDER — HYDROMORPHONE HCL PF 1 MG/ML IJ SOLN
0.2500 mg | INTRAMUSCULAR | Status: DC | PRN
  Administered 2011-12-10 (×4): 0.5 mg via INTRAVENOUS

## 2011-12-10 MED ORDER — MENTHOL 3 MG MT LOZG: 1.0000 | LOZENGE | OROMUCOSAL | Status: DC | PRN

## 2011-12-10 MED ORDER — MEPERIDINE HCL 25 MG/ML IJ SOLN: 6.2500 mg | INTRAMUSCULAR | Status: DC | PRN

## 2011-12-10 MED ORDER — SODIUM CHLORIDE 0.9 % IV SOLN: 1.0000 ug/kg/h | INTRAVENOUS | Status: DC | PRN

## 2011-12-10 MED ORDER — CEFAZOLIN SODIUM-DEXTROSE 2-3 GM-% IV SOLR
2.0000 g | INTRAVENOUS | Status: AC
  Administered 2011-12-10: 2 g via INTRAVENOUS
  Filled 2011-12-10: qty 50

## 2011-12-10 MED ORDER — LANOLIN HYDROUS EX OINT: 1.0000 "application " | TOPICAL_OINTMENT | CUTANEOUS | Status: DC | PRN

## 2011-12-10 MED ORDER — SCOPOLAMINE 1 MG/3DAYS TD PT72
1.0000 | MEDICATED_PATCH | Freq: Once | TRANSDERMAL | Status: AC
  Administered 2011-12-10: 1.5 mg via TRANSDERMAL

## 2011-12-10 MED ORDER — NALBUPHINE HCL 10 MG/ML IJ SOLN: 5.0000 mg | INTRAMUSCULAR | Status: DC | PRN

## 2011-12-10 MED ORDER — DIPHENHYDRAMINE HCL 50 MG/ML IJ SOLN: 25.0000 mg | INTRAMUSCULAR | Status: DC | PRN

## 2011-12-10 MED ORDER — DIPHENHYDRAMINE HCL 50 MG/ML IJ SOLN
12.5000 mg | INTRAMUSCULAR | Status: DC | PRN
  Administered 2011-12-10: 12.5 mg via INTRAVENOUS
  Filled 2011-12-10: qty 1

## 2011-12-10 MED ORDER — ONDANSETRON HCL 4 MG/2ML IJ SOLN
INTRAMUSCULAR | Status: DC | PRN
  Administered 2011-12-10: 4 mg via INTRAVENOUS

## 2011-12-10 MED ORDER — METOCLOPRAMIDE HCL 5 MG/ML IJ SOLN: 10.0000 mg | Freq: Three times a day (TID) | INTRAMUSCULAR | Status: DC | PRN

## 2011-12-10 MED ORDER — SODIUM CHLORIDE 0.9 % IJ SOLN: 3.0000 mL | INTRAMUSCULAR | Status: DC | PRN

## 2011-12-10 MED ORDER — ONDANSETRON HCL 4 MG/2ML IJ SOLN
4.0000 mg | Freq: Three times a day (TID) | INTRAMUSCULAR | Status: DC | PRN
  Filled 2011-12-10: qty 2

## 2011-12-10 MED ORDER — PROMETHAZINE HCL 25 MG/ML IJ SOLN: 6.2500 mg | INTRAMUSCULAR | Status: DC | PRN

## 2011-12-10 MED ORDER — TETANUS-DIPHTH-ACELL PERTUSSIS 5-2.5-18.5 LF-MCG/0.5 IM SUSP
0.5000 mL | Freq: Once | INTRAMUSCULAR | Status: AC
  Administered 2011-12-11: 0.5 mL via INTRAMUSCULAR
  Filled 2011-12-10: qty 0.5

## 2011-12-10 MED ORDER — FAMOTIDINE IN NACL 20-0.9 MG/50ML-% IV SOLN
20.0000 mg | Freq: Once | INTRAVENOUS | Status: AC
  Administered 2011-12-10: 20 mg via INTRAVENOUS
  Filled 2011-12-10: qty 50

## 2011-12-10 MED ORDER — ONDANSETRON HCL 4 MG/2ML IJ SOLN
4.0000 mg | INTRAMUSCULAR | Status: DC | PRN
  Administered 2011-12-10: 4 mg via INTRAVENOUS

## 2011-12-10 MED ORDER — HYDROMORPHONE HCL PF 1 MG/ML IJ SOLN
INTRAMUSCULAR | Status: AC
  Administered 2011-12-10: 0.5 mg via INTRAVENOUS
  Filled 2011-12-10: qty 1

## 2011-12-10 MED ORDER — SENNOSIDES-DOCUSATE SODIUM 8.6-50 MG PO TABS
2.0000 | ORAL_TABLET | Freq: Every day | ORAL | Status: DC
  Administered 2011-12-11 – 2011-12-13 (×3): 2 via ORAL

## 2011-12-10 MED ORDER — BUPIVACAINE HCL (PF) 0.25 % IJ SOLN
INTRAMUSCULAR | Status: DC | PRN
  Administered 2011-12-10: 10 mL

## 2011-12-10 MED ORDER — MORPHINE SULFATE (PF) 0.5 MG/ML IJ SOLN
INTRAMUSCULAR | Status: DC | PRN
  Administered 2011-12-10: 100 ug via INTRATHECAL

## 2011-12-10 MED ORDER — PHENYLEPHRINE HCL 10 MG/ML IJ SOLN
INTRAMUSCULAR | Status: DC | PRN
  Administered 2011-12-10: 80 ug via INTRAVENOUS

## 2011-12-10 MED ORDER — DIPHENHYDRAMINE HCL 25 MG PO CAPS
25.0000 mg | ORAL_CAPSULE | ORAL | Status: DC | PRN
  Administered 2011-12-11 – 2011-12-12 (×2): 25 mg via ORAL
  Filled 2011-12-10 (×2): qty 1

## 2011-12-10 MED ORDER — SIMETHICONE 80 MG PO CHEW
80.0000 mg | CHEWABLE_TABLET | Freq: Three times a day (TID) | ORAL | Status: DC
  Administered 2011-12-11 – 2011-12-13 (×12): 80 mg via ORAL

## 2011-12-10 MED ORDER — OXYTOCIN 10 UNIT/ML IJ SOLN
INTRAMUSCULAR | Status: DC | PRN
  Administered 2011-12-10: 40 [IU] via INTRAMUSCULAR

## 2011-12-10 MED ORDER — NALOXONE HCL 0.4 MG/ML IJ SOLN: 0.4000 mg | INTRAMUSCULAR | Status: DC | PRN

## 2011-12-10 MED ORDER — HYDROMORPHONE HCL PF 1 MG/ML IJ SOLN
0.5000 mg | INTRAMUSCULAR | Status: DC | PRN
  Administered 2011-12-10 (×2): 0.5 mg via INTRAVENOUS

## 2011-12-10 MED ORDER — FENTANYL CITRATE 0.05 MG/ML IJ SOLN
INTRAMUSCULAR | Status: DC | PRN
  Administered 2011-12-10: 50 ug via INTRAVENOUS
  Administered 2011-12-10: 37.5 ug via INTRAVENOUS

## 2011-12-10 MED ORDER — LACTATED RINGERS IV SOLN
INTRAVENOUS | Status: DC
  Administered 2011-12-10 (×3): via INTRAVENOUS

## 2011-12-10 MED ORDER — CITRIC ACID-SODIUM CITRATE 334-500 MG/5ML PO SOLN
30.0000 mL | Freq: Once | ORAL | Status: AC
  Administered 2011-12-10: 30 mL via ORAL
  Filled 2011-12-10: qty 15

## 2011-12-10 MED ORDER — DIPHENHYDRAMINE HCL 25 MG PO CAPS: 25.0000 mg | ORAL_CAPSULE | Freq: Four times a day (QID) | ORAL | Status: DC | PRN

## 2011-12-10 MED ORDER — SCOPOLAMINE 1 MG/3DAYS TD PT72
MEDICATED_PATCH | TRANSDERMAL | Status: AC
  Administered 2011-12-10: 1.5 mg via TRANSDERMAL
  Filled 2011-12-10: qty 1

## 2011-12-10 MED ORDER — OXYTOCIN 40 UNITS IN LACTATED RINGERS INFUSION - SIMPLE MED: 62.5000 mL/h | INTRAVENOUS | Status: AC

## 2011-12-10 MED ORDER — ZOLPIDEM TARTRATE 5 MG PO TABS
5.0000 mg | ORAL_TABLET | Freq: Every evening | ORAL | Status: DC | PRN
  Administered 2011-12-12: 5 mg via ORAL
  Filled 2011-12-10: qty 1

## 2011-12-10 MED ORDER — IBUPROFEN 600 MG PO TABS
600.0000 mg | ORAL_TABLET | Freq: Four times a day (QID) | ORAL | Status: DC
  Administered 2011-12-10 – 2011-12-12 (×6): 600 mg via ORAL

## 2011-12-10 MED ORDER — DIBUCAINE 1 % RE OINT: 1.0000 "application " | TOPICAL_OINTMENT | RECTAL | Status: DC | PRN

## 2011-12-10 MED ORDER — ONDANSETRON HCL 4 MG PO TABS
4.0000 mg | ORAL_TABLET | ORAL | Status: DC | PRN
  Administered 2011-12-12: 4 mg via ORAL
  Filled 2011-12-10: qty 1

## 2011-12-10 MED ORDER — 0.9 % SODIUM CHLORIDE (POUR BTL) OPTIME
TOPICAL | Status: DC | PRN
  Administered 2011-12-10: 1000 mL

## 2011-12-10 MED ORDER — IBUPROFEN 600 MG PO TABS
600.0000 mg | ORAL_TABLET | Freq: Four times a day (QID) | ORAL | Status: DC | PRN
  Filled 2011-12-10 (×7): qty 1

## 2011-12-10 MED ORDER — BUPIVACAINE HCL 0.75 % IJ SOLN
INTRAMUSCULAR | Status: DC | PRN
  Administered 2011-12-10: 1.4 mL

## 2011-12-10 MED ORDER — WITCH HAZEL-GLYCERIN EX PADS: 1.0000 "application " | MEDICATED_PAD | CUTANEOUS | Status: DC | PRN

## 2011-12-10 MED ORDER — FENTANYL CITRATE 0.05 MG/ML IJ SOLN
INTRAMUSCULAR | Status: DC | PRN
  Administered 2011-12-10: 12.5 ug via INTRATHECAL

## 2011-12-10 MED ORDER — SIMETHICONE 80 MG PO CHEW: 80.0000 mg | CHEWABLE_TABLET | ORAL | Status: DC | PRN

## 2011-12-10 MED ORDER — PRENATAL MULTIVITAMIN CH
1.0000 | ORAL_TABLET | Freq: Every day | ORAL | Status: DC
  Administered 2011-12-11 – 2011-12-14 (×4): 1 via ORAL
  Filled 2011-12-10 (×4): qty 1

## 2011-12-10 MED ORDER — OXYCODONE-ACETAMINOPHEN 5-325 MG PO TABS
1.0000 | ORAL_TABLET | ORAL | Status: DC | PRN
  Administered 2011-12-11 (×4): 1 via ORAL
  Administered 2011-12-12 – 2011-12-13 (×8): 2 via ORAL
  Administered 2011-12-13: 1 via ORAL
  Administered 2011-12-13 (×2): 2 via ORAL
  Administered 2011-12-13 – 2011-12-14 (×2): 1 via ORAL
  Administered 2011-12-14: 2 via ORAL
  Administered 2011-12-14: 1 via ORAL
  Administered 2011-12-14: 2 via ORAL
  Administered 2011-12-14: 1 via ORAL
  Filled 2011-12-10: qty 2
  Filled 2011-12-10: qty 1
  Filled 2011-12-10: qty 2
  Filled 2011-12-10: qty 1
  Filled 2011-12-10 (×2): qty 2
  Filled 2011-12-10: qty 1
  Filled 2011-12-10 (×7): qty 2
  Filled 2011-12-10: qty 1
  Filled 2011-12-10: qty 2
  Filled 2011-12-10: qty 1
  Filled 2011-12-10 (×3): qty 2
  Filled 2011-12-10 (×2): qty 1

## 2011-12-10 SURGICAL SUPPLY — 28 items
BARRIER ADHS 3X4 INTERCEED (GAUZE/BANDAGES/DRESSINGS) IMPLANT
CHLORAPREP W/TINT 26ML (MISCELLANEOUS) ×2 IMPLANT
CLOTH BEACON ORANGE TIMEOUT ST (SAFETY) ×2 IMPLANT
CONTAINER PREFILL 10% NBF 15ML (MISCELLANEOUS) IMPLANT
DRSG COVADERM 4X10 (GAUZE/BANDAGES/DRESSINGS) IMPLANT
ELECT REM PT RETURN 9FT ADLT (ELECTROSURGICAL) ×2
ELECTRODE REM PT RTRN 9FT ADLT (ELECTROSURGICAL) ×1 IMPLANT
EXTRACTOR VACUUM M CUP 4 TUBE (SUCTIONS) IMPLANT
GLOVE BIO SURGEON STRL SZ 6.5 (GLOVE) ×4 IMPLANT
GOWN PREVENTION PLUS LG XLONG (DISPOSABLE) ×6 IMPLANT
KIT ABG SYR 3ML LUER SLIP (SYRINGE) IMPLANT
NEEDLE HYPO 22GX1.5 SAFETY (NEEDLE) IMPLANT
NEEDLE HYPO 25X5/8 SAFETYGLIDE (NEEDLE) ×2 IMPLANT
NS IRRIG 1000ML POUR BTL (IV SOLUTION) ×2 IMPLANT
PACK C SECTION WH (CUSTOM PROCEDURE TRAY) ×2 IMPLANT
PAD OB MATERNITY 4.3X12.25 (PERSONAL CARE ITEMS) IMPLANT
SLEEVE SCD COMPRESS KNEE MED (MISCELLANEOUS) IMPLANT
STAPLER VISISTAT 35W (STAPLE) IMPLANT
SUT CHROMIC 0 CTX 36 (SUTURE) ×4 IMPLANT
SUT PLAIN 0 NONE (SUTURE) IMPLANT
SUT PLAIN 2 0 XLH (SUTURE) IMPLANT
SUT VIC AB 0 CT1 27 (SUTURE) ×3
SUT VIC AB 0 CT1 27XBRD ANBCTR (SUTURE) ×3 IMPLANT
SUT VIC AB 4-0 KS 27 (SUTURE) IMPLANT
SYR CONTROL 10ML LL (SYRINGE) IMPLANT
TOWEL OR 17X24 6PK STRL BLUE (TOWEL DISPOSABLE) ×4 IMPLANT
TRAY FOLEY CATH 14FR (SET/KITS/TRAYS/PACK) ×2 IMPLANT
WATER STERILE IRR 1000ML POUR (IV SOLUTION) ×2 IMPLANT

## 2011-12-10 NOTE — Transfer of Care (Signed)
Immediate Anesthesia Transfer of Care Note  Patient: Stacy Olson  Procedure(s) Performed: Procedure(s) (LRB) with comments: CESAREAN SECTION (N/A)  Patient Location: PACU  Anesthesia Type: Spinal  Level of Consciousness: sedated  Airway & Oxygen Therapy: Patient Spontanous Breathing  Post-op Assessment: Report given to PACU RN  Post vital signs: Reviewed and stable  Complications: No apparent anesthesia complications

## 2011-12-10 NOTE — MAU Note (Signed)
Sent from OB's office for primary c-section for transverse lie;

## 2011-12-10 NOTE — MAU Note (Signed)
Pt asking questions about having a c-section; having second thoughts of the plan of care; wishes to speak to her doctor about the surgery and options;

## 2011-12-10 NOTE — Anesthesia Postprocedure Evaluation (Signed)
Anesthesia Post Note  Patient: Stacy Olson  Procedure(s) Performed: Procedure(s) (LRB): CESAREAN SECTION (N/A)  Anesthesia type: Spinal  Patient location: PACU  Post pain: Pain level controlled  Post assessment: Post-op Vital signs reviewed  Last Vitals:  Filed Vitals:   12/10/11 1530  BP: 119/82  Pulse: 66  Temp:   Resp: 16    Post vital signs: Reviewed  Level of consciousness: awake  Complications: No apparent anesthesia complications

## 2011-12-10 NOTE — MAU Note (Signed)
When RN entered the room to ready pt for her c-section, pt was on the phone and asked for 5 minutes to finish her phone call;

## 2011-12-10 NOTE — Anesthesia Preprocedure Evaluation (Addendum)
Anesthesia Evaluation  Patient identified by MRN, date of birth, ID band Patient awake    Reviewed: Allergy & Precautions, H&P , NPO status , Patient's Chart, lab work & pertinent test results  Airway Mallampati: I TM Distance: >3 FB Neck ROM: full    Dental No notable dental hx.    Pulmonary neg pulmonary ROS,    Pulmonary exam normal       Cardiovascular negative cardio ROS      Neuro/Psych negative neurological ROS  negative psych ROS   GI/Hepatic negative GI ROS, Neg liver ROS,   Endo/Other  negative endocrine ROS  Renal/GU negative Renal ROS  negative genitourinary   Musculoskeletal negative musculoskeletal ROS (+)   Abdominal Normal abdominal exam  (+)   Peds negative pediatric ROS (+)  Hematology negative hematology ROS (+)   Anesthesia Other Findings   Reproductive/Obstetrics (+) Pregnancy                           Anesthesia Physical Anesthesia Plan  ASA: II and Emergent  Anesthesia Plan: Spinal   Post-op Pain Management:    Induction:   Airway Management Planned:   Additional Equipment:   Intra-op Plan:   Post-operative Plan:   Informed Consent: I have reviewed the patients History and Physical, chart, labs and discussed the procedure including the risks, benefits and alternatives for the proposed anesthesia with the patient or authorized representative who has indicated his/her understanding and acceptance.     Plan Discussed with: CRNA and Surgeon  Anesthesia Plan Comments:         Anesthesia Quick Evaluation

## 2011-12-10 NOTE — Anesthesia Procedure Notes (Signed)
Spinal  Patient location during procedure: OR Start time: 12/10/2011 1:49 PM End time: 12/10/2011 1:53 PM Staffing Anesthesiologist: Sandrea Hughs Performed by: anesthesiologist  Preanesthetic Checklist Completed: patient identified, site marked, surgical consent, pre-op evaluation, timeout performed, IV checked, risks and benefits discussed and monitors and equipment checked Spinal Block Patient position: sitting Prep: DuraPrep Patient monitoring: heart rate, cardiac monitor, continuous pulse ox and blood pressure Approach: midline Location: L3-4 Injection technique: single-shot Needle Needle type: Sprotte  Needle gauge: 24 G Needle length: 9 cm Needle insertion depth: 5 cm Assessment Sensory level: T6

## 2011-12-10 NOTE — H&P (Signed)
36 year old G 3 P 2002 at 38 weeks and 6 days EDD 12/18/2011 presents for Cesarean Section. Today in the office baby was noted to be transverse lie and she is 2 cm dilated and having pain?contractions. Cervix was long and 1 cm last week. She declined ECV last week when the baby was noted to be transverse lie then.  Prenatal care complicated by: 1. Fetal ventriculomegaly - right greater than left with also noted interhemispheric fluid collection of unknown etiology. Scanned by MFM multiples times. No definite diagnosis noted.   2.  Quad screen was positive for [facility] , counseled by MFM.  3. LGA Last ultrasound HC measured 42 weeks  4. GBBS is negative  Afebrile Vital signs stable General alert and oriented Lung CTAB Car RRR Abdomen is large gravid Baby is T lie ultrasound head is RLQ Cervix is now 2 cm dilated  IMPRESSION: IUP at term Cervical change Persistent Tlie Fetal Ventriculomegaly Interhemispheric fluid collection Abnormal AFP  PLAN: Primary LTCS Today NPO since 7 pm Risks reviewed with the patient Nicu MD notified of above fetal findings

## 2011-12-10 NOTE — Brief Op Note (Signed)
12/10/2011  2:38 PM  PATIENT:  Viki Konde  36 y.o. female  PRE-OPERATIVE DIAGNOSIS:  Transverse Lie; in Labor  POST-OPERATIVE DIAGNOSIS:  Transverse Lie; in Labor  PROCEDURE:  Procedure(s) (LRB) with comments: CESAREAN SECTION (N/A) Primary Low transverse Cesarean Section  SURGEON:  Surgeon(s) and Role:    * Jeani Hawking, MD - Primary  PHYSICIAN ASSISTANT:   ASSISTANTS: none   ANESTHESIA:   spinal  EBL:  Total I/O In: 800 [I.V.:800] Out: 825 [Urine:25; Blood:800]  BLOOD ADMINISTERED:none  DRAINS: Urinary Catheter (Foley)   LOCAL MEDICATIONS USED:  XYLOCAINE   SPECIMEN:  No Specimen  DISPOSITION OF SPECIMEN:  N/A  COUNTS:  YES  TOURNIQUET:  * No tourniquets in log *  DICTATION: .Other Dictation: Dictation Number [id number]  PLAN OF CARE: Admit to inpatient   PATIENT DISPOSITION:  PACU - hemodynamically stable.   Delay start of Pharmacological VTE agent (>24hrs) due to surgical blood loss or risk of bleeding: not applicable

## 2011-12-11 ENCOUNTER — Encounter (HOSPITAL_COMMUNITY)
Admission: AD | Disposition: A | Discharge status: Home / Self Care | Payer: Self-pay | Source: Ambulatory Visit | Attending: Obstetrics and Gynecology

## 2011-12-11 ENCOUNTER — Encounter (HOSPITAL_COMMUNITY): Payer: Self-pay | Admitting: Obstetrics and Gynecology

## 2011-12-11 ENCOUNTER — Inpatient Hospital Stay (HOSPITAL_COMMUNITY): Admit: 2011-12-11 | Payer: Self-pay | Admitting: Obstetrics and Gynecology

## 2011-12-11 LAB — CBC
HCT: 23.4 % — ABNORMAL LOW (ref 36.0–46.0)
Hemoglobin: 7.5 g/dL — ABNORMAL LOW (ref 12.0–15.0)
MCH: 21.7 pg — ABNORMAL LOW (ref 26.0–34.0)
MCHC: 32.1 g/dL (ref 30.0–36.0)
RBC: 3.46 MIL/uL — ABNORMAL LOW (ref 3.87–5.11)

## 2011-12-11 SURGERY — Surgical Case
Anesthesia: Regional

## 2011-12-11 MED ORDER — INFLUENZA VIRUS VACC SPLIT PF IM SUSP
0.5000 mL | INTRAMUSCULAR | Status: AC
  Administered 2011-12-12: 0.5 mL via INTRAMUSCULAR
  Filled 2011-12-11: qty 0.5

## 2011-12-11 MED ORDER — FERROUS SULFATE 325 (65 FE) MG PO TABS
325.0000 mg | ORAL_TABLET | Freq: Three times a day (TID) | ORAL | Status: DC
  Administered 2011-12-11 – 2011-12-14 (×9): 325 mg via ORAL
  Filled 2011-12-11 (×9): qty 1

## 2011-12-11 NOTE — Op Note (Signed)
NAMESKARLET, LYONS            ACCOUNT NO.:  [id number]  MEDICAL RECORD NO.:  [id number]  LOCATION:  9319                          FACILITY:  [facility]  PHYSICIAN:  Jaliel Deavers L. Adelai Achey, M.D.DATE OF BIRTH:  1975-12-27  DATE OF PROCEDURE:  12/10/2011 DATE OF DISCHARGE:                              OPERATIVE REPORT   PREOPERATIVE DIAGNOSES:  Intrauterine pregnancy at term, transverse lie, fetal ventriculomegaly, and labor.  POSTOPERATIVE DIAGNOSES:  Intrauterine pregnancy at term, transverse lie, fetal ventriculomegaly, and labor.  PROCEDURE:  Primary low-transverse cesarean section.  SURGEON:  Welton Bord L. Vincente Poli, M.D.  ASSISTANT:  None.  EBL:  Less than 500 mL.  COMPLICATIONS:  None.  DRAINS:  Foley catheter.  PROCEDURE:  The patient was taken to the operating room, and she was given her spinal.  She was then prepped and draped and a Foley catheter was inserted.  A low-transverse incision was made, carried down to the fascia.  Fascia was scored in the midline and extended laterally. Rectus muscles were separated in the midline.  The peritoneum was entered bluntly.  The peritoneal incision was then stretched.  The bladder blade was inserted.  The lower uterine segment was identified and the bladder flap was created sharply and then digitally.  The bladder blade was then readjusted.  A low-transverse incision was made in the uterus.  The uterus was entered using a hemostat.  Amniotic fluid was clear.  The baby was in K lie it with the head in the right lower quadrant, easily the head was shifted to the vertex position and delivered easily and appeared to be a very large head.  The baby was a female infant.  The cord was clamped and cut.  The baby was handed to the awaiting neonatal team and then was subsequently taken to the MICU because of some difficulty with respirations.  Cord blood was obtained as well as cord pH.  The placenta was manually removed, noted to be normal,  intact with a 3-vessel cord.  The uterus was exteriorized and cleared of all clots and debris.  The uterine incision was closed in 1 layer using 0-chromic in a running locked stitch.  Uterus was returned to the abdomen.  Peritoneum was closed using 0-Vicryl.  The fascia was closed using 0-Vicryl starting each corner meeting in the midline. After irrigation of subcutaneous layer, the skin was closed with a 4-0 Vicryl on the subcuticular, Dermabond was applied.  All sponge, lap, and instrument counts were correct x2.  The patient went to recovery room in a stable condition.     Jalik Gellatly L. Vincente Poli, M.D.     Florestine Avers  D:  12/10/2011  T:  12/11/2011  Job:  [id number]

## 2011-12-11 NOTE — Progress Notes (Signed)
SW attempted to meet with parents to complete assessment, but they were not available at this time.  SW to attempt again at a later time.  No social issues noted in [facility].

## 2011-12-11 NOTE — Anesthesia Postprocedure Evaluation (Signed)
  Anesthesia Post-op Note  Patient: Stacy Olson  Procedure(s) Performed: Procedure(s) (LRB) with comments: CESAREAN SECTION (N/A)  Patient Location: [facility]  Anesthesia Type: Spinal  Level of Consciousness: awake  Airway and Oxygen Therapy: Patient Spontanous Breathing  Post-op Pain: none  Post-op Assessment: Patient's Cardiovascular Status Stable, Respiratory Function Stable, Patent Airway, No signs of Nausea or vomiting, Adequate PO intake, Pain level controlled, No headache, No backache, No residual numbness and No residual motor weakness  Post-op Vital Signs: Reviewed and stable  Complications: No apparent anesthesia complications

## 2011-12-11 NOTE — Progress Notes (Signed)
Subjective: Postpartum Day 1: Cesarean Delivery Patient reports tolerating PO and no problems voiding.  Baby in NICU on O2. Denies SOB or dizziness  Objective: Vital signs in last 24 hours: Temp:  [97.5 F (36.4 C)-98.7 F (37.1 C)] 98.2 F (36.8 C) (09/12 0555) Pulse Rate:  [64-91] 73  (09/12 0555) Resp:  [14-18] 18  (09/12 0555) BP: (100-132)/(63-85) 108/71 mmHg (09/12 0555) SpO2:  [97 %-100 %] 100 % (09/12 0555) Weight:  [76.839 kg (169 lb 6.4 oz)] 76.839 kg (169 lb 6.4 oz) (09/11 1156)  Physical Exam:  General: alert and cooperative Lochia: appropriate Uterine Fundus: firm Incision: healing well DVT Evaluation: No evidence of DVT seen on physical exam.   Basename 12/11/11 0547 12/10/11 1235  HGB 7.5* 9.7*  HCT 23.4* 30.2*    Assessment/Plan: Status post Cesarean section. Doing well postoperatively.  Continue current care. FeSo4   Becky Berberian G 12/11/2011, 9:08 AM

## 2011-12-11 NOTE — Progress Notes (Signed)
UR Chart review completed.  

## 2011-12-11 NOTE — Addendum Note (Signed)
Addendum  created 12/11/11 1610 by Suella Grove, CRNA   Modules edited:Notes Section

## 2011-12-12 LAB — CBC WITH DIFFERENTIAL/PLATELET
Basophils Absolute: 0 10*3/uL (ref 0.0–0.1)
Eosinophils Absolute: 0 10*3/uL (ref 0.0–0.7)
Eosinophils Relative: 0 % (ref 0–5)
HCT: 22.6 % — ABNORMAL LOW (ref 36.0–46.0)
Lymphocytes Relative: 8 % — ABNORMAL LOW (ref 12–46)
Lymphs Abs: 1.3 10*3/uL (ref 0.7–4.0)
MCH: 21.9 pg — ABNORMAL LOW (ref 26.0–34.0)
MCV: 67.9 fL — ABNORMAL LOW (ref 78.0–100.0)
Monocytes Absolute: 0.9 10*3/uL (ref 0.1–1.0)
Platelets: 92 10*3/uL — ABNORMAL LOW (ref 150–400)
RDW: 14.5 % (ref 11.5–15.5)
WBC: 15.1 10*3/uL — ABNORMAL HIGH (ref 4.0–10.5)

## 2011-12-12 MED ORDER — BISACODYL 10 MG RE SUPP
10.0000 mg | Freq: Every day | RECTAL | Status: DC | PRN
  Administered 2011-12-13 (×3): 10 mg via RECTAL
  Filled 2011-12-12 (×2): qty 1

## 2011-12-12 NOTE — Progress Notes (Signed)
Subjective: Postpartum Day 2: Cesarean Delivery Patient reports tolerating PO, + flatus and no problems voiding.  Reports dizziness this am with ambulation in hallway. Baby continues on nasal 02 in NICU  Objective: Vital signs in last 24 hours: Temp:  [97.6 F (36.4 C)-98.2 F (36.8 C)] 98.2 F (36.8 C) (09/13 0558) Pulse Rate:  [75-86] 86  (09/13 0558) Resp:  [16-20] 18  (09/13 0558) BP: (106-113)/(63-73) 109/67 mmHg (09/13 0558) SpO2:  [98 %-100 %] 100 % (09/12 2145)  Physical Exam:  General: alert and cooperative Lochia: appropriate Uterine Fundus: firm Incision: healing well DVT Evaluation: No evidence of DVT seen on physical exam. DTR's 1+ no significant pedal edema  Basename 12/11/11 0547 12/10/11 1235  HGB 7.5* 9.7*  HCT 23.4* 30.2*    Assessment/Plan: Status post Cesarean section. Postoperative course complicated by anemia and thrombocytopenia  Cbc and discontinued Motrin Stacy Olson G 12/12/2011, 8:55 AM

## 2011-12-12 NOTE — Progress Notes (Signed)
Notified Okey Regal Curtis,NP regarding CBC results of hg 7.3 hct  22.6 wbc 15.1 plts 92. No new orders. Barbera Setters

## 2011-12-12 NOTE — Progress Notes (Signed)
Pt complaining of pain 8/10 after being medicated with 2 percocets at 2000. Pt requested the doctor be called. Notified Dr. Henderson Cloud. Pt has no IV access and last platelet count at 0924 on 12/12/2011 was 92. Dr. Henderson Cloud stated due to risk of bleeding he did not want to give ibuprofen or toradol. Mylicon given and offered pt medicine to help her rest and she refused. Heating pad offered. Will continue to monitor pt.

## 2011-12-13 LAB — CBC
HCT: 25.7 % — ABNORMAL LOW (ref 36.0–46.0)
Hemoglobin: 8.2 g/dL — ABNORMAL LOW (ref 12.0–15.0)
MCH: 21.9 pg — ABNORMAL LOW (ref 26.0–34.0)
MCHC: 31.9 g/dL (ref 30.0–36.0)
MCV: 68.5 fL — ABNORMAL LOW (ref 78.0–100.0)
Platelets: 108 K/uL — ABNORMAL LOW (ref 150–400)
RBC: 3.75 MIL/uL — ABNORMAL LOW (ref 3.87–5.11)
RDW: 14.8 % (ref 11.5–15.5)
WBC: 15.2 K/uL — ABNORMAL HIGH (ref 4.0–10.5)

## 2011-12-13 MED ORDER — BISACODYL 10 MG RE SUPP
10.0000 mg | Freq: Once | RECTAL | Status: AC
  Filled 2011-12-13: qty 1

## 2011-12-13 NOTE — Progress Notes (Signed)
POD #3 C/O upper and mid adb pain. Passing some flatus. No incisional pain. Voiding. Tolerating regular diet.  Blood pressure 122/79, pulse 84, temperature 98.6 F (37 C), temperature source Oral, resp. rate 18, height 5\' 6"  (1.676 m), weight 76.839 kg (169 lb 6.4 oz), last menstrual period 03/19/2011, SpO2 100.00%, unknown if currently breastfeeding.  Lungs CTA Cor RRR Abd soft, good BS, incision healing well Ext NT without cords  A/P: D/W patient low platelets and therefore no NSAID.         D/W gas pain. Will give Dulcolax suppository. D/W goals of pain management.         Check CBC

## 2011-12-14 MED ORDER — DIBUCAINE 1 % RE OINT: 1.0000 "application " | TOPICAL_OINTMENT | RECTAL | Status: DC | PRN

## 2011-12-14 MED ORDER — BISACODYL 10 MG RE SUPP: 10.0000 mg | Freq: Every day | RECTAL | Status: AC | PRN

## 2011-12-14 MED ORDER — SENNOSIDES-DOCUSATE SODIUM 8.6-50 MG PO TABS: 2.0000 | ORAL_TABLET | Freq: Every day | ORAL | Status: DC

## 2011-12-14 MED ORDER — OXYCODONE-ACETAMINOPHEN 5-325 MG PO TABS: 1.0000 | ORAL_TABLET | Freq: Four times a day (QID) | ORAL | Status: AC | PRN

## 2011-12-14 MED ORDER — FERROUS SULFATE 325 (65 FE) MG PO TABS
325.0000 mg | ORAL_TABLET | Freq: Every day | ORAL | Status: AC
Start: 2011-12-14 — End: 2015-12-12

## 2011-12-14 NOTE — Discharge Summary (Signed)
Obstetric Discharge Summary Reason for Admission: cesarean section Prenatal Procedures: ultrasound and MFM consultation Intrapartum Procedures: cesarean: low cervical, transverse Postpartum Procedures: none Complications-Operative and Postpartum: constipation, thrombocytopenia Hemoglobin  Date Value Range Status  12/13/2011 8.2* 12.0 - 15.0 g/dL Final     HCT  Date Value Range Status  12/13/2011 25.7* 36.0 - 46.0 % Final    Physical Exam:  General: alert, cooperative and no distress Lochia: appropriate Uterine Fundus: firm Incision: healing well DVT Evaluation: No evidence of DVT seen on physical exam.  Discharge Diagnoses: Term Pregnancy-delivered  Discharge Information: Date: 12/14/2011 Activity: pelvic rest Diet: routine Medications: PNV, Iron and Percocet Condition: stable Instructions: refer to practice specific booklet Discharge to: home Follow-up Information    In 2 weeks to follow up.         Newborn Data: Live born female  Birth Weight: 8 lb 9.6 oz (3900 g) APGAR: 8, 8  Home with mother.  Alver Leete II,Aamari Strawderman E 12/14/2011, 9:48 AM

## 2011-12-14 NOTE — Progress Notes (Signed)
Had BM after suppository, now feels much better  Blood pressure 121/78, pulse 78, temperature 98.8 F (37.1 C), temperature source Oral, resp. rate 15, height 5\' 6"  (1.676 m), weight 76.839 kg (169 lb 6.4 oz), last menstrual period 03/19/2011, SpO2 99.00%, unknown if currently breastfeeding.  Results for orders placed during the hospital encounter of 12/10/11 (from the past 24 hour(s))  CBC     Status: Abnormal   Collection Time   12/13/11 11:30 AM      Component Value Range   WBC 15.2 (*) 4.0 - 10.5 K/uL   RBC 3.75 (*) 3.87 - 5.11 MIL/uL   Hemoglobin 8.2 (*) 12.0 - 15.0 g/dL   HCT 29.5 (*) 28.4 - 13.2 %   MCV 68.5 (*) 78.0 - 100.0 fL   MCH 21.9 (*) 26.0 - 34.0 pg   MCHC 31.9  30.0 - 36.0 g/dL   RDW 44.0  10.2 - 72.5 %   Platelets 108 (*) 150 - 400 K/uL   Abdomen soft, incision healing well  A: Satisfactory  P: D/C home     Instructions reviewed

## 2011-12-14 NOTE — Progress Notes (Signed)
Discharge instructions reviewed with patient and significant other .  Patient able to "Teach Back" home care and signs/symptoms to report to MD.  No home equipment needed.  Discharged to NICU to room-in wityh baby prior to baby's discharge.

## 2012-03-21 ENCOUNTER — Emergency Department (HOSPITAL_COMMUNITY)
Admission: EM | Admit: 2012-03-21 | Discharge: 2012-03-21 | Disposition: A | Discharge status: Home / Self Care | Payer: Self-pay | Attending: Emergency Medicine | Admitting: Emergency Medicine

## 2012-03-21 DIAGNOSIS — N898 Other specified noninflammatory disorders of vagina: Secondary | ICD-10-CM | POA: Insufficient documentation

## 2012-03-21 DIAGNOSIS — N939 Abnormal uterine and vaginal bleeding, unspecified: Secondary | ICD-10-CM

## 2012-03-21 DIAGNOSIS — R197 Diarrhea, unspecified: Secondary | ICD-10-CM | POA: Insufficient documentation

## 2012-03-21 DIAGNOSIS — IMO0001 Reserved for inherently not codable concepts without codable children: Secondary | ICD-10-CM | POA: Insufficient documentation

## 2012-03-21 DIAGNOSIS — R509 Fever, unspecified: Secondary | ICD-10-CM | POA: Insufficient documentation

## 2012-03-21 DIAGNOSIS — Z3202 Encounter for pregnancy test, result negative: Secondary | ICD-10-CM | POA: Insufficient documentation

## 2012-03-21 DIAGNOSIS — R112 Nausea with vomiting, unspecified: Secondary | ICD-10-CM | POA: Insufficient documentation

## 2012-03-21 DIAGNOSIS — R1110 Vomiting, unspecified: Secondary | ICD-10-CM

## 2012-03-21 LAB — COMPREHENSIVE METABOLIC PANEL
Albumin: 3.2 g/dL — ABNORMAL LOW (ref 3.5–5.2)
Alkaline Phosphatase: 110 U/L (ref 39–117)
BUN: 9 mg/dL (ref 6–23)
CO2: 23 mEq/L (ref 19–32)
Chloride: 106 mEq/L (ref 96–112)
Creatinine, Ser: 0.84 mg/dL (ref 0.50–1.10)
GFR calc Af Amer: >90 mL/min (ref >90)
Glucose, Bld: 105 mg/dL — ABNORMAL HIGH (ref 70–99)
Sodium: 139 mEq/L (ref 135–145)
Total Protein: 7.1 g/dL (ref 6.0–8.3)

## 2012-03-21 LAB — CBC WITH DIFFERENTIAL/PLATELET
Basophils Absolute: 0 10*3/uL (ref 0.0–0.1)
HCT: 32.6 % — ABNORMAL LOW (ref 36.0–46.0)
Lymphocytes Relative: 31 % (ref 12–46)
Lymphs Abs: 1.4 10*3/uL (ref 0.7–4.0)
MCV: 63.9 fL — ABNORMAL LOW (ref 78.0–100.0)
Monocytes Absolute: 0.3 10*3/uL (ref 0.1–1.0)
Neutro Abs: 2.7 10*3/uL (ref 1.7–7.7)
Platelets: 163 10*3/uL (ref 150–400)
RBC: 5.1 MIL/uL (ref 3.87–5.11)
RDW: 15.1 % (ref 11.5–15.5)
WBC: 4.5 10*3/uL (ref 4.0–10.5)

## 2012-03-21 LAB — POCT PREGNANCY, URINE: Preg Test, Ur: NEGATIVE

## 2012-03-21 LAB — PROTIME-INR: Prothrombin Time: 14.2 seconds (ref 11.6–15.2)

## 2012-03-21 LAB — APTT: aPTT: 29 seconds (ref 24–37)

## 2012-03-21 MED ORDER — ONDANSETRON HCL 4 MG/2ML IJ SOLN
4.0000 mg | Freq: Once | INTRAMUSCULAR | Status: AC
  Administered 2012-03-21: 4 mg via INTRAVENOUS
  Filled 2012-03-21: qty 2

## 2012-03-21 MED ORDER — ONDANSETRON 8 MG PO TBDP: ORAL_TABLET | ORAL | Status: DC

## 2012-03-21 MED ORDER — SODIUM CHLORIDE 0.9 % IV BOLUS (SEPSIS)
1000.0000 mL | Freq: Once | INTRAVENOUS | Status: DC
Start: 2012-03-21 — End: 2012-03-21

## 2012-03-21 NOTE — ED Provider Notes (Signed)
History     CSN: [id number]  Arrival date & time 03/21/12  0710   First MD Initiated Contact with Patient 03/21/12 [phone]      Chief Complaint - vomiting and diarrhea   Patient is a 36 y.o. female presenting with vomiting. The history is provided by the patient.  Emesis  This is a new problem. The current episode started more than 2 days ago. The problem occurs 2 to 4 times per day. The problem has been gradually worsening. The emesis has an appearance of stomach contents. Maximum temperature: unrecorded fever. Associated symptoms include chills, diarrhea, a fever and myalgias. Pertinent negatives include no abdominal pain and no cough. Risk factors: denies travel, denies antibiotics.  pt reports she has had vomiting (clear liquids) for 4 days and diarrhea (nonbloody) for 3 days No abd pain  She also reports vaginal bleeding for over 2 months since the birth of her child.  She reports she had c-section and has had vaginal bleeding since.  She has seen gynecology and had ultrasound and placed on medications but reports "it doesn't work"  She thinks she last saw gyn 2 weeks ago She is not breastfeeding at this time  PMH - none  Past Surgical History  Procedure Date  . No past surgeries   . Cesarean section 12/10/2011    Procedure: CESAREAN SECTION;  Surgeon: Jeani Hawking, MD;  Location: [facility];  Service: Obstetrics;  Laterality: N/A;    Family History  Problem Relation Age of Onset  . Anesthesia problems Neg Hx   . Hypotension Neg Hx   . Malignant hyperthermia Neg Hx   . Pseudochol deficiency Neg Hx   . Other Neg Hx     History  Substance Use Topics  . Smoking status: Never Smoker   . Smokeless tobacco: Not on file  . Alcohol Use: No    OB History    Grav Para Term Preterm Abortions TAB SAB Ect Mult Living   3 3 3       3       Review of Systems  Constitutional: Positive for fever and chills.  Respiratory: Negative for cough.   Gastrointestinal: Positive for  nausea, vomiting and diarrhea. Negative for abdominal pain.  Genitourinary: Positive for vaginal bleeding.  Musculoskeletal: Positive for myalgias.  Psychiatric/Behavioral: Negative for agitation.  All other systems reviewed and are negative.    Allergies  Review of patient's allergies indicates no known allergies.  Home Medications   Current Outpatient Rx  Name  Route  Sig  Dispense  Refill  . DIBUCAINE 1 % RE OINT   Rectal   Place 1 application rectally as needed.   28.4 g   2   . FERROUS SULFATE 325 (65 FE) MG PO TABS   Oral   Take 1 tablet (325 mg total) by mouth daily with breakfast.   30 tablet   2   . SENNOSIDES-DOCUSATE SODIUM 8.6-50 MG PO TABS   Oral   Take 2 tablets by mouth at bedtime.   30 tablet   1     BP 104/63  Pulse 78  Temp 98.9 F (37.2 C) (Oral)  Resp 14  SpO2 100%  Breastfeeding? Unknown  Physical Exam CONSTITUTIONAL: Well developed/well nourished HEAD AND FACE: Normocephalic/atraumatic EYES: EOMI/PERRL ENMT: Mucous membranes moist NECK: supple no meningeal signs SPINE:entire spine nontender CV: S1/S2 noted, no murmurs/rubs/gallops noted LUNGS: Lungs are clear to auscultation bilaterally, no apparent distress ABDOMEN: soft, nontender, no rebound or guarding GU:no  cva tenderness No cmt.  Os closed.  No vaginal bleeding.  No lacerations or evidence of trauma.  Chaperone present NEURO: Pt is awake/alert, moves all extremitiesx4 EXTREMITIES: pulses normal, full ROM SKIN: warm, color normal PSYCH: no abnormalities of mood noted  ED Course  Procedures    Labs Reviewed  COMPREHENSIVE METABOLIC PANEL  CBC WITH DIFFERENTIAL  PROTIME-INR  APTT   7:41 AM Pt with two complaints - for her vomiting and diarrhea will rehydrate and check electrolytes For her chronic vag bleeding, will check cbc and coags to see if there is any abnormality.  She will also need pelvic exam 10:12 AM Labs reassuring.  On my exam no signs of vag bleeding.   However she reported to nurse that she started bleeding upon standing.  Otherwise she is improved.  I never received call back from her gynecologist.  I advised her to call tomorrow for followup.  She is not breast feeding will give zofran for vomiting.  She denies abd pain and her abdomen is soft   MDM  Nursing notes including past medical history and social history reviewed and considered in documentation Labs/vital reviewed and considered Previous records reviewed and considered - h/o c-section per chart         Joya Gaskins, MD 03/21/12 1013

## 2012-03-21 NOTE — ED Notes (Signed)
Pt states that she has been having N/v/d x 2 days with the vomiting starting 7 days ago.  She also states that she has been having vag. Bleeding x 3 months. States that she has been evaluated for the bleeding by [facility] and was placed on hormones which she says did help initially.

## 2012-09-13 ENCOUNTER — Emergency Department (HOSPITAL_COMMUNITY)
Admission: EM | Admit: 2012-09-13 | Discharge: 2012-09-13 | Disposition: A | Discharge status: Home / Self Care | Payer: BC Managed Care – PPO | Attending: Emergency Medicine | Admitting: Emergency Medicine

## 2012-09-13 ENCOUNTER — Encounter (HOSPITAL_COMMUNITY): Payer: Self-pay | Admitting: Emergency Medicine

## 2012-09-13 DIAGNOSIS — Z3202 Encounter for pregnancy test, result negative: Secondary | ICD-10-CM | POA: Insufficient documentation

## 2012-09-13 DIAGNOSIS — J020 Streptococcal pharyngitis: Secondary | ICD-10-CM

## 2012-09-13 DIAGNOSIS — N390 Urinary tract infection, site not specified: Secondary | ICD-10-CM

## 2012-09-13 LAB — COMPREHENSIVE METABOLIC PANEL
Albumin: 3.7 g/dL (ref 3.5–5.2)
BUN: 8 mg/dL (ref 6–23)
Calcium: 9 mg/dL (ref 8.4–10.5)
Creatinine, Ser: 0.76 mg/dL (ref 0.50–1.10)
Total Protein: 7.8 g/dL (ref 6.0–8.3)

## 2012-09-13 LAB — URINALYSIS, ROUTINE W REFLEX MICROSCOPIC
Bilirubin Urine: NEGATIVE
Ketones, ur: NEGATIVE mg/dL
Nitrite: POSITIVE — AB
Urobilinogen, UA: 0.2 mg/dL (ref 0.0–1.0)

## 2012-09-13 LAB — CBC WITH DIFFERENTIAL/PLATELET
Basophils Relative: 1 % (ref 0–1)
Eosinophils Relative: 3 % (ref 0–5)
HCT: 33.3 % — ABNORMAL LOW (ref 36.0–46.0)
Hemoglobin: 10.8 g/dL — ABNORMAL LOW (ref 12.0–15.0)
Lymphs Abs: 1.5 10*3/uL (ref 0.7–4.0)
MCV: 64.2 fL — ABNORMAL LOW (ref 78.0–100.0)
Monocytes Relative: 6 % (ref 3–12)
Platelets: 160 10*3/uL (ref 150–400)
RBC: 5.19 MIL/uL — ABNORMAL HIGH (ref 3.87–5.11)
WBC: 6.4 10*3/uL (ref 4.0–10.5)

## 2012-09-13 LAB — URINE MICROSCOPIC-ADD ON

## 2012-09-13 LAB — POCT PREGNANCY, URINE: Preg Test, Ur: NEGATIVE

## 2012-09-13 MED ORDER — CEPHALEXIN 500 MG PO CAPS: 500.0000 mg | ORAL_CAPSULE | Freq: Four times a day (QID) | ORAL | Status: DC

## 2012-09-13 NOTE — ED Notes (Signed)
abd and  Leg pain x 1 week  Hurts to void  lmp 1 week ago

## 2012-09-13 NOTE — ED Notes (Signed)
Discharge and prescriptions reviewed with pt. Pt verbalized understanding.

## 2012-09-13 NOTE — ED Notes (Addendum)
Pt reports sore throat for 1-2wks, and lower abd pain, with pain when urinating. Difficulty swallowing and phlegm. Pt alert and oriented.

## 2012-09-13 NOTE — ED Provider Notes (Signed)
History     CSN: [id number]  Arrival date & time 09/13/12  1036   First MD Initiated Contact with Patient 09/13/12 1220      Chief Complaint  Patient presents with  . Abdominal Pain    (Consider location/radiation/quality/duration/timing/severity/associated sxs/prior treatment) HPI Comments: Patient presents with multiple complaints.  She has had three weeks of sore throat, congestion in her throat.  No shortness of breath.  She has felt chilled but no definite fevers.  She also complains of lower abdominal discomfort that is worse when she urinates.  No diarrhea or constipation.    Patient is a 37 y.o. female presenting with abdominal pain. The history is provided by the patient.  Abdominal Pain This is a new problem. The current episode started 1 to 2 hours ago. The problem occurs constantly. The problem has not changed since onset.Associated symptoms include abdominal pain. Exacerbated by: urinating. Nothing relieves the symptoms. She has tried nothing for the symptoms. The treatment provided no relief.    History reviewed. No pertinent past medical history.  Past Surgical History  Procedure Laterality Date  . No past surgeries    . Cesarean section  12/10/2011    Procedure: CESAREAN SECTION;  Surgeon: Jeani Hawking, MD;  Location: [facility];  Service: Obstetrics;  Laterality: N/A;    Family History  Problem Relation Age of Onset  . Anesthesia problems Neg Hx   . Hypotension Neg Hx   . Malignant hyperthermia Neg Hx   . Pseudochol deficiency Neg Hx   . Other Neg Hx     History  Substance Use Topics  . Smoking status: Never Smoker   . Smokeless tobacco: Not on file  . Alcohol Use: No    OB History   Grav Para Term Preterm Abortions TAB SAB Ect Mult Living   3 3 3       3       Review of Systems  Gastrointestinal: Positive for abdominal pain.  All other systems reviewed and are negative.    Allergies  Bactrim  Home Medications   Current Outpatient Rx   Name  Route  Sig  Dispense  Refill  . acetaminophen (TYLENOL) 325 MG tablet   Oral   Take 650 mg by mouth every 6 (six) hours as needed. For pain         . ferrous sulfate 325 (65 FE) MG tablet   Oral   Take 1 tablet (325 mg total) by mouth daily with breakfast.   30 tablet   2   . ondansetron (ZOFRAN ODT) 8 MG disintegrating tablet      8mg  ODT q4 hours prn nausea   4 tablet   0     BP 117/80  Pulse 80  Temp(Src) 98.3 F (36.8 C)  Resp 16  SpO2 99%  Physical Exam  Nursing note and vitals reviewed. Constitutional: She is oriented to person, place, and time. She appears well-developed and well-nourished. No distress.  HENT:  Head: Normocephalic and atraumatic.  PO mildly erythematous without exudates.    Neck: Normal range of motion. Neck supple.  Cardiovascular: Normal rate and regular rhythm.  Exam reveals no gallop and no friction rub.   No murmur heard. Pulmonary/Chest: Effort normal and breath sounds normal. No respiratory distress. She has no wheezes.  Abdominal: Soft. Bowel sounds are normal. She exhibits no distension. There is no tenderness.  Musculoskeletal: Normal range of motion.  Lymphadenopathy:    She has no cervical adenopathy.  Neurological:  She is alert and oriented to person, place, and time.  Skin: Skin is warm and dry. She is not diaphoretic.    ED Course  Procedures (including critical care time)  Labs Reviewed  RAPID STREP SCREEN  URINALYSIS, ROUTINE W REFLEX MICROSCOPIC  COMPREHENSIVE METABOLIC PANEL  CBC WITH DIFFERENTIAL  PREGNANCY, URINE   No results found.   No diagnosis found.    MDM  She appears to have both a uti and strep throat.  Will discharge with keflex.  Return prn.        Geoffery Lyons, MD 09/13/12 1315

## 2012-09-15 LAB — URINE CULTURE

## 2013-01-15 ENCOUNTER — Emergency Department (HOSPITAL_BASED_OUTPATIENT_CLINIC_OR_DEPARTMENT_OTHER)
Admission: EM | Admit: 2013-01-15 | Discharge: 2013-01-15 | Disposition: A | Discharge status: Home / Self Care | Payer: BC Managed Care – PPO | Attending: Emergency Medicine | Admitting: Emergency Medicine

## 2013-01-15 ENCOUNTER — Encounter (HOSPITAL_BASED_OUTPATIENT_CLINIC_OR_DEPARTMENT_OTHER): Payer: Self-pay | Admitting: Emergency Medicine

## 2013-01-15 DIAGNOSIS — H6692 Otitis media, unspecified, left ear: Secondary | ICD-10-CM

## 2013-01-15 DIAGNOSIS — J0100 Acute maxillary sinusitis, unspecified: Secondary | ICD-10-CM

## 2013-01-15 DIAGNOSIS — Z792 Long term (current) use of antibiotics: Secondary | ICD-10-CM | POA: Insufficient documentation

## 2013-01-15 DIAGNOSIS — R42 Dizziness and giddiness: Secondary | ICD-10-CM | POA: Insufficient documentation

## 2013-01-15 DIAGNOSIS — H6690 Otitis media, unspecified, unspecified ear: Secondary | ICD-10-CM | POA: Insufficient documentation

## 2013-01-15 MED ORDER — AMOXICILLIN-POT CLAVULANATE 500-125 MG PO TABS: 1.0000 | ORAL_TABLET | Freq: Three times a day (TID) | ORAL | Status: DC

## 2013-01-15 MED ORDER — HYDROCODONE-ACETAMINOPHEN 5-325 MG PO TABS: 2.0000 | ORAL_TABLET | ORAL | Status: DC | PRN

## 2013-01-15 NOTE — ED Notes (Signed)
Pt is here for pain in her left ear for 2 days.  Pt has had some congestion with this.  No cough.  Pt reports that she feels that she had a fever last night

## 2013-01-15 NOTE — ED Provider Notes (Signed)
CSN: [id number]     Arrival date & time 01/15/13  2110 History  This chart was scribed for Nelia Shi, MD by Danella Maiers, ED Scribe. This patient was seen in room MH10/MH10 and the patient's care was started at 9:33 PM.   Chief Complaint  Patient presents with  . Otalgia   HPI HPI Comments: Stacy Olson is a 37 y.o. female who presents to the Emergency Department complaining of constant bilateral ear pain, worse on the left, that started two days ago with associated facial pain. Pt reports a subjective fever last night. She also reports purulent secretions from her throat. She denies cough, emesis.  .Patient denied any recent travel out of the country or exposure to anyone with fever.  History reviewed. No pertinent past medical history. Past Surgical History  Procedure Laterality Date  . No past surgeries    . Cesarean section  12/10/2011    Procedure: CESAREAN SECTION;  Surgeon: Jeani Hawking, MD;  Location: [facility];  Service: Obstetrics;  Laterality: N/A;   Family History  Problem Relation Age of Onset  . Anesthesia problems Neg Hx   . Hypotension Neg Hx   . Malignant hyperthermia Neg Hx   . Pseudochol deficiency Neg Hx   . Other Neg Hx    History  Substance Use Topics  . Smoking status: Never Smoker   . Smokeless tobacco: Not on file  . Alcohol Use: No   OB History   Grav Para Term Preterm Abortions TAB SAB Ect Mult Living   3 3 3       3      Review of Systems A complete 10 system review of systems was obtained and all systems are negative except as noted in the HPI and PMH.   Allergies  Bactrim  Home Medications   Current Outpatient Rx  Name  Route  Sig  Dispense  Refill  . acetaminophen (TYLENOL) 325 MG tablet   Oral   Take 650 mg by mouth every 6 (six) hours as needed. For pain         . amoxicillin-clavulanate (AUGMENTIN) 500-125 MG per tablet   Oral   Take 1 tablet (500 mg total) by mouth every 8 (eight) hours.   21 tablet   0   .  cephALEXin (KEFLEX) 500 MG capsule   Oral   Take 1 capsule (500 mg total) by mouth 4 (four) times daily.   28 capsule   0   . EXPIRED: ferrous sulfate 325 (65 FE) MG tablet   Oral   Take 1 tablet (325 mg total) by mouth daily with breakfast.   30 tablet   2   . HYDROcodone-acetaminophen (NORCO/VICODIN) 5-325 MG per tablet   Oral   Take 2 tablets by mouth every 4 (four) hours as needed for pain.   15 tablet   0   . ondansetron (ZOFRAN ODT) 8 MG disintegrating tablet      8mg  ODT q4 hours prn nausea   4 tablet   0    BP 112/71  Pulse 79  Temp(Src) 98.5 F (36.9 C) (Oral)  Resp 18  SpO2 100%  LMP 01/01/2013 Physical Exam  Nursing note and vitals reviewed. Constitutional: She is oriented to person, place, and time. She appears well-developed and well-nourished. No distress.  HENT:  Head: Normocephalic and atraumatic.  Mouth/Throat: No oropharyngeal exudate.  Behind the left tympanic membrane there is fluid present and appears to be infected. Oropharynx clear. No adenopathy.  Eyes: Pupils are equal, round, and reactive to light.  Neck: Normal range of motion.  Cardiovascular: Normal rate and intact distal pulses.   Pulmonary/Chest: No respiratory distress.  Abdominal: Normal appearance. She exhibits no distension.  Musculoskeletal: Normal range of motion.  Neurological: She is alert and oriented to person, place, and time. No cranial nerve deficit.  Skin: Skin is warm and dry. No rash noted.  Psychiatric: She has a normal mood and affect. Her behavior is normal.    ED Course  Procedures (including critical care time) Medications - No data to display  DIAGNOSTIC STUDIES: Oxygen Saturation is 100% on RA, normal by my interpretation.    COORDINATION OF CARE: 9:43 PM- Discussed treatment plan with pt which includes antibiotics. Pt agrees to plan.    Labs Review Labs Reviewed - No data to display Imaging Review No results found.  EKG Interpretation   None        MDM   1. Maxillary sinusitis, acute   2. Otitis media, left          I personally performed the services described in this documentation, which was scribed in my presence. The recorded information has been reviewed and considered.   Nelia Shi, MD 01/17/13 [phone]

## 2014-01-30 ENCOUNTER — Encounter (HOSPITAL_BASED_OUTPATIENT_CLINIC_OR_DEPARTMENT_OTHER): Payer: Self-pay | Admitting: Emergency Medicine

## 2014-03-03 ENCOUNTER — Other Ambulatory Visit: Payer: Self-pay | Admitting: Otolaryngology

## 2014-03-03 ENCOUNTER — Ambulatory Visit
Admission: RE | Admit: 2014-03-03 | Discharge: 2014-03-03 | Disposition: A | Discharge status: Home / Self Care | Payer: BC Managed Care – PPO | Source: Ambulatory Visit | Attending: Otolaryngology | Admitting: Otolaryngology

## 2014-03-03 DIAGNOSIS — R0982 Postnasal drip: Secondary | ICD-10-CM

## 2014-03-03 DIAGNOSIS — J329 Chronic sinusitis, unspecified: Secondary | ICD-10-CM

## 2014-03-09 ENCOUNTER — Other Ambulatory Visit: Payer: Self-pay | Admitting: Otolaryngology

## 2014-03-09 DIAGNOSIS — M899 Disorder of bone, unspecified: Secondary | ICD-10-CM

## 2014-03-10 ENCOUNTER — Ambulatory Visit
Admission: RE | Admit: 2014-03-10 | Discharge: 2014-03-10 | Disposition: A | Discharge status: Home / Self Care | Payer: BC Managed Care – PPO | Source: Ambulatory Visit | Attending: Otolaryngology | Admitting: Otolaryngology

## 2014-03-10 DIAGNOSIS — M899 Disorder of bone, unspecified: Secondary | ICD-10-CM

## 2015-11-07 ENCOUNTER — Other Ambulatory Visit (INDEPENDENT_AMBULATORY_CARE_PROVIDER_SITE_OTHER): Payer: BLUE CROSS/BLUE SHIELD

## 2015-11-07 ENCOUNTER — Encounter: Payer: Self-pay | Admitting: Gastroenterology

## 2015-11-07 ENCOUNTER — Ambulatory Visit (INDEPENDENT_AMBULATORY_CARE_PROVIDER_SITE_OTHER): Payer: BLUE CROSS/BLUE SHIELD | Admitting: Gastroenterology

## 2015-11-07 VITALS — BP 116/78 | HR 70 | Ht 66.0 in | Wt 176.1 lb

## 2015-11-07 DIAGNOSIS — R196 Halitosis: Secondary | ICD-10-CM

## 2015-11-07 DIAGNOSIS — K219 Gastro-esophageal reflux disease without esophagitis: Secondary | ICD-10-CM | POA: Diagnosis not present

## 2015-11-07 LAB — COMPREHENSIVE METABOLIC PANEL
ALT: 11 U/L (ref 0–35)
AST: 12 U/L (ref 0–37)
Albumin: 4.2 g/dL (ref 3.5–5.2)
Alkaline Phosphatase: 80 U/L (ref 39–117)
BUN: 9 mg/dL (ref 6–23)
CO2: 29 mEq/L (ref 19–32)
Calcium: 9.2 mg/dL (ref 8.4–10.5)
Chloride: 105 mEq/L (ref 96–112)
Creatinine, Ser: 0.78 mg/dL (ref 0.40–1.20)
GFR: 105.21 mL/min (ref >60.00)
GLUCOSE: 106 mg/dL — AB (ref 70–99)
Potassium: 3.7 mEq/L (ref 3.5–5.1)
Sodium: 138 mEq/L (ref 135–145)
TOTAL PROTEIN: 7.4 g/dL (ref 6.0–8.3)
Total Bilirubin: 0.5 mg/dL (ref 0.2–1.2)

## 2015-11-07 MED ORDER — OMEPRAZOLE 40 MG PO CPDR: 40.0000 mg | DELAYED_RELEASE_CAPSULE | Freq: Every day | ORAL | 3 refills | Status: AC

## 2015-11-07 NOTE — Patient Instructions (Signed)
We have sent omeprazole to your pharmacy Go to the basement for labs today  Follow up as needed

## 2015-11-07 NOTE — Progress Notes (Signed)
HPI :  40 y/o female with PMH as outlined below presenting for a new patient visit for subjective symptoms of bad breath and reflux.   She reports bad breath which has bothered her a long time. She reports when brushing her teeth she endorses "mucous build up" in her throat which can build up several times per day. She was given a trial of Zyrtec which did not help too much.  She states she was seen by ENT and had a negative laryngoscopy, reported no issues with the sinuses or tonsils. She was given a course of Clindamycin and Zantac. She was given a 30 day supply of clarithromycin, taking it only PRN and "as needed". She did not take the full course. She is not sure if the antibiotic had helped too much. She reports altered taste in her mouth.   She has some occasional heartburn. She denies any regurgitation. She has some occasional belching. No dysphagia. No vomiting. No routine or chronic abdominal pains. Eating well. No blood in the stools. She reports abnormal stools when taking clindamycin.   She denies use of retainers of dentures. She reports a very strong sense of smell, and she thinks subjectively she has bad breath and has had others tell her the same. No tooth or mouth pain. She denies history of tonsillectomy. She has seen a dentist about 2 months ago, states no issues with her teeth. She has tried using mouthwash.   She has a history of anemia. She reports a history of menorrhagia which is thought to have caused this in the past, seen by GYN.   Past Medical History:  Diagnosis Date  . Anemia   . Urethrocele      Past Surgical History:  Procedure Laterality Date  . CESAREAN SECTION  12/10/2011   Procedure: CESAREAN SECTION;  Surgeon: Cyril Mourning, MD;  Location: [facility];  Service: Obstetrics;  Laterality: N/A;   Family History  Problem Relation Age of Onset  . Diabetes Mother   . Diabetes Father   . Hypertension Father   . Kidney failure Father   . Anesthesia problems  Neg Hx   . Hypotension Neg Hx   . Malignant hyperthermia Neg Hx   . Pseudochol deficiency Neg Hx   . Other Neg Hx    Social History  Substance Use Topics  . Smoking status: Never Smoker  . Smokeless tobacco: Never Used  . Alcohol use No   Current Outpatient Prescriptions  Medication Sig Dispense Refill  . CLINDAMYCIN HCL PO Take 1 capsule by mouth as needed.    . ferrous sulfate 325 (65 FE) MG tablet Take 1 tablet (325 mg total) by mouth daily with breakfast. 30 tablet 2  . ranitidine (ZANTAC) 150 MG capsule Take 150 mg by mouth 2 (two) times daily.    Marland Kitchen omeprazole (PRILOSEC) 40 MG capsule Take 1 capsule (40 mg total) by mouth daily. 30 capsule 3   No current facility-administered medications for this visit.    Allergies  Allergen Reactions  . Bactrim [Sulfamethoxazole-Trimethoprim]      Review of Systems: All systems reviewed and negative except where noted in HPI.    No results found.  Physical Exam: BP 116/78 (BP Location: Left Arm, Patient Position: Sitting, Cuff Size: Normal)   Pulse 70   Ht 5\' 6"  (1.676 m) Comment: height measured without shoes  Wt 176 lb 2 oz (79.9 kg)   BMI 28.43 kg/m  Constitutional: Pleasant,well-developed, female in no acute distress. HEENT:  Normocephalic and atraumatic. No scleral icterus. Neck supple.  Cardiovascular: Normal rate, regular rhythm.  Pulmonary/chest: Effort normal and breath sounds normal. No wheezing, rales or rhonchi. Abdominal: Soft, nondistended, nontender. Bowel sounds active throughout. There are no masses palpable.  Extremities: no edema Lymphadenopathy: No cervical adenopathy noted. Neurological: Alert and oriented to person place and time. Skin: Skin is warm and dry. No rashes noted. Psychiatric: Normal mood and affect. Behavior is normal.   ASSESSMENT AND PLAN: 40 y/o female with history as above presenting with chief complaint of halitosis. ENT evaluation as above reportedly negative. She has been given an  empiric trial of antibiotics for which she was noncompliant. She has mild reflux symptoms but no alarm symptoms or dysphagia.  I discussed with her that halitosis is almost related to the oral cavity. Less commonly it is related to the nasal passages or tonsils. It is very unlikely related to her esophagus or stomach, unless she is frequently belching, which she denies. She has no dysphagia or symptoms concerning for a zenkers. It can very rarely be related to renal or liver disease and will obtain CMP to ensure normal.   Moving forward I asked her to follow up with her dentist. She should use mouthwash 1 hour after brushing teeth, used twice daily and ensure she flosses daily. I would recommend against empiric antibiotics as this does not provide long term benefit. I offered her a trial of omeprazole 40mg  for a month to see if this provides benefit of her symptoms and better treat her reflux. She can let me know if this has helped and can follow up as needed for this issue.   Ware Cellar, MD [facility]astroenter[facility] Pager [phone]

## 2015-11-22 ENCOUNTER — Ambulatory Visit (INDEPENDENT_AMBULATORY_CARE_PROVIDER_SITE_OTHER): Payer: BLUE CROSS/BLUE SHIELD | Admitting: Gastroenterology

## 2015-11-22 ENCOUNTER — Encounter: Payer: Self-pay | Admitting: Gastroenterology

## 2015-11-22 ENCOUNTER — Other Ambulatory Visit (INDEPENDENT_AMBULATORY_CARE_PROVIDER_SITE_OTHER): Payer: BLUE CROSS/BLUE SHIELD

## 2015-11-22 VITALS — BP 100/70 | HR 72 | Ht 66.0 in | Wt 176.4 lb

## 2015-11-22 DIAGNOSIS — D62 Acute posthemorrhagic anemia: Secondary | ICD-10-CM

## 2015-11-22 DIAGNOSIS — D649 Anemia, unspecified: Secondary | ICD-10-CM

## 2015-11-22 DIAGNOSIS — K5900 Constipation, unspecified: Secondary | ICD-10-CM

## 2015-11-22 DIAGNOSIS — K219 Gastro-esophageal reflux disease without esophagitis: Secondary | ICD-10-CM | POA: Diagnosis not present

## 2015-11-22 DIAGNOSIS — R196 Halitosis: Secondary | ICD-10-CM

## 2015-11-22 DIAGNOSIS — K6289 Other specified diseases of anus and rectum: Secondary | ICD-10-CM | POA: Diagnosis not present

## 2015-11-22 LAB — CBC WITH DIFFERENTIAL/PLATELET
BASOS PCT: 0.4 % (ref 0.0–3.0)
Basophils Absolute: 0 10*3/uL (ref 0.0–0.1)
EOS ABS: 0.1 10*3/uL (ref 0.0–0.7)
Eosinophils Relative: 0.9 % (ref 0.0–5.0)
HCT: 34 % — ABNORMAL LOW (ref 36.0–46.0)
HEMOGLOBIN: 10.9 g/dL — AB (ref 12.0–15.0)
LYMPHS PCT: 30.7 % (ref 12.0–46.0)
Lymphs Abs: 2.4 10*3/uL (ref 0.7–4.0)
MCHC: 32 g/dL (ref 30.0–36.0)
MONO ABS: 0.5 10*3/uL (ref 0.1–1.0)
Monocytes Relative: 6.6 % (ref 3.0–12.0)
NEUTROS ABS: 4.7 10*3/uL (ref 1.4–7.7)
NEUTROS PCT: 61.4 % (ref 43.0–77.0)
PLATELETS: 162 10*3/uL (ref 150.0–400.0)
RBC: 5.09 Mil/uL (ref 3.87–5.11)
RDW: 14.5 % (ref 11.5–15.5)
WBC: 7.7 10*3/uL (ref 4.0–10.5)

## 2015-11-22 MED ORDER — NA SULFATE-K SULFATE-MG SULF 17.5-3.13-1.6 GM/177ML PO SOLN: 1.0000 | Freq: Once | ORAL | 0 refills | Status: AC

## 2015-11-22 NOTE — Progress Notes (Signed)
HPI :  INTAKE on 11/07/15: 40 y/o female with PMH as outlined below presenting for a new patient visit for subjective symptoms of bad breath and reflux.   She reports bad breath which has bothered her a long time. She reports when brushing her teeth she endorses "mucous build up" in her throat which can build up several times per day. She was given a trial of Zyrtec which did not help too much.  She states she was seen by ENT and had a negative laryngoscopy, reported no issues with the sinuses or tonsils. She was given a course of Clindamycin and Zantac. She was given a 30 day supply of clarithromycin, taking it only PRN and "as needed". She did not take the full course. She is not sure if the antibiotic had helped too much. She reports altered taste in her mouth.   She has some occasional heartburn. She denies any regurgitation. She has some occasional belching. No dysphagia. No vomiting. No routine or chronic abdominal pains. Eating well. No blood in the stools. She reports abnormal stools when taking clindamycin.   She denies use of retainers of dentures. She reports a very strong sense of smell, and she thinks subjectively she has bad breath and has had others tell her the same. No tooth or mouth pain. She denies history of tonsillectomy. She has seen a dentist about 2 months ago, states no issues with her teeth. She has tried using mouthwash.   She has a history of anemia. She reports a history of menorrhagia which is thought to have caused this in the past, seen by GYN.   SINCE LAST VISIT:  Patient called for an acute visit, she reports today she is requesting an EGD and colonoscopy.   Since the last visit we placed her on a trial of omeprazole 40mg . She reports taking it once daily, and she does not think she has noticed any difference in her symptoms. She reports she continues to have some stable reflux symptoms. No dysphagia. She is having some belching.   She otherwise endorses  chronic constipation. She is having a bowel movement once every 3-4 days. Passing hard stools which is her baseline. She denies any blood in the stools at baseline. Rare blood on the tissue with wipes.  She also endorses pain in her rectum with some bowel movements which can be severe. It is not in her anal area but "deeper inside" in her rectum which she did not endorse at the last visit. She is requesting an endoscopic evaluation for her iron deficiency.   She is taking iron supplement for longstanding iron deficiency. She reports longstanding heavy menstrual cycles. Menses occur monthly and last 4 days, she states very heavy the first 2 days. She uses roughly 30 pads over 4 days. She takes 325mg  iron sulfate for a long time which makes her stools dark.   No FH of colon cancer. She additionally reports a lot of fatigue.   Upon asking if she had any other questions at the end of the visit she became quite tearful. She is requesting EGD and colonoscopy for piece of mind, has a lot of anxiety about these symptoms, and also anxious to see a gynecologist for her other symptoms. She reports a bad experience with other providers she has seen and asking for recommended providers.   Past Medical History:  Diagnosis Date  . Anemia   . Urethrocele      Past Surgical History:  Procedure Laterality Date  .  CESAREAN SECTION  12/10/2011   Procedure: CESAREAN SECTION;  Surgeon: Cyril Mourning, MD;  Location: [facility];  Service: Obstetrics;  Laterality: N/A;   Family History  Problem Relation Age of Onset  . Diabetes Mother   . Diabetes Father   . Hypertension Father   . Kidney failure Father   . Anesthesia problems Neg Hx   . Hypotension Neg Hx   . Malignant hyperthermia Neg Hx   . Pseudochol deficiency Neg Hx   . Other Neg Hx    Social History  Substance Use Topics  . Smoking status: Never Smoker  . Smokeless tobacco: Never Used  . Alcohol use No   Current Outpatient Prescriptions    Medication Sig Dispense Refill  . ferrous sulfate 325 (65 FE) MG tablet Take 1 tablet (325 mg total) by mouth daily with breakfast. 30 tablet 2  . omeprazole (PRILOSEC) 40 MG capsule Take 1 capsule (40 mg total) by mouth daily. 30 capsule 3   No current facility-administered medications for this visit.    Allergies  Allergen Reactions  . Bactrim [Sulfamethoxazole-Trimethoprim]      Review of Systems: All systems reviewed and negative except where noted in HPI.   Lab Results  Component Value Date   WBC 7.7 11/22/2015   HGB 10.9 (L) 11/22/2015   HCT 34.0 (L) 11/22/2015   MCV 66.7 Repeated and verified X2. (L) 11/22/2015   PLT 162.0 11/22/2015   CBC Latest Ref Rng & Units 11/22/2015 09/13/2012 03/21/2012  WBC 4.0 - 10.5 K/uL 7.7 6.4 4.5  Hemoglobin 12.0 - 15.0 g/dL 10.9(L) 10.8(L) 10.6(L)  Hematocrit 36.0 - 46.0 % 34.0(L) 33.3(L) 32.6(L)  Platelets 150.0 - 400.0 K/uL 162.0 160 163      Lab Results  Component Value Date   CREATININE 0.78 11/07/2015   BUN 9 11/07/2015   NA 138 11/07/2015   K 3.7 11/07/2015   CL 105 11/07/2015   CO2 29 11/07/2015    Lab Results  Component Value Date   ALT 11 11/07/2015   AST 12 11/07/2015   ALKPHOS 80 11/07/2015   BILITOT 0.5 11/07/2015     Physical Exam: BP 100/70 (BP Location: Left Arm, Patient Position: Sitting, Cuff Size: Normal)   Pulse 72   Ht 5\' 6"  (1.676 m)   Wt 176 lb 6 oz (80 kg)   LMP 11/22/2015   BMI 28.47 kg/m  Constitutional: Pleasant,well-developed, female in no acute distress. HEENT: Normocephalic and atraumatic. Conjunctivae are normal. No scleral icterus. Neck supple.  Cardiovascular: Normal rate, regular rhythm.  Pulmonary/chest: Effort normal and breath sounds normal. No wheezing, rales or rhonchi. Abdominal: Soft, nondistended,mildly protuberant, nontender. Bowel sounds active throughout. There are no masses palpable.  Extremities: no edema Lymphadenopathy: No cervical adenopathy noted. Neurological:  Alert and oriented to person place and time. Skin: Skin is warm and dry. No rashes noted. Psychiatric: Normal mood and affect. Behavior is normal.   ASSESSMENT AND PLAN: 40 y/o female here for follow up for the following issues:  Anemia - longstanding microcytosis with reported iron deficiency, per patient ongoing for years. Based on symptoms seems very likely related to menorrhagia. She has chronic constipation without any recent changes, but does endorse some rectal discomfort periodically. She was tearful in her interactions today, is quite anxious about this issue, and voiced strongly her desire for EGD and colonoscopy for "piece of mind". While I reassured her I it's less likely her GI tract is driving this process, I offered her the exams, and  to evaluate her rectal discomfort, and discussed the risks / benefits of each with her. I do think it is more likely her anemia is being driven by her menorrhagia and recommended to her that she follow up with a gynecologist. Checked CBC to ensure stable today given her fatigue, and it is. She agreed.   Rectal pain - as above, will await colonoscopy, treat constipation  Constipation - recommend miralax daily to help with this, suspect being driven by longstanding iron suppplementation  Hallitosis - see prior note regarding full discussion of this issue. I don't think related to her GI tract but will await EGD. More than likely this is related to the oral cavity. Recommend flossing daily and using mouth wash twice daily, and to follow up with dentist as needed. CMP normal.   [place] Cellar, MD [facility] Gastroenterology Pager [phone]

## 2015-11-22 NOTE — Patient Instructions (Signed)
Your physician has requested that you go to the basement for the following lab work before leaving today:CBC.  You can start over the counter Miralax mixing 17 grams in 8 oz of water daily.   You have been scheduled for an endoscopy and colonoscopy. Please follow the written instructions given to you at your visit today. Please pick up your prep supplies at the pharmacy within the next 1-3 days. If you use inhalers (even only as needed), please bring them with you on the day of your procedure. Your physician has requested that you go to www.[email or web address].com and enter the access code given to you at your visit today. This web site gives a general overview about your procedure. However, you should still follow specific instructions given to you by our office regarding your preparation for the procedure.  Call and schedule an appointment with [facility]. There phone number is [phone].

## 2015-11-23 ENCOUNTER — Other Ambulatory Visit (INDEPENDENT_AMBULATORY_CARE_PROVIDER_SITE_OTHER): Payer: BLUE CROSS/BLUE SHIELD

## 2015-11-23 ENCOUNTER — Other Ambulatory Visit: Payer: Self-pay

## 2015-11-23 DIAGNOSIS — R7989 Other specified abnormal findings of blood chemistry: Secondary | ICD-10-CM

## 2015-11-23 DIAGNOSIS — D509 Iron deficiency anemia, unspecified: Secondary | ICD-10-CM | POA: Diagnosis not present

## 2015-11-23 LAB — IBC PANEL
Iron: 23 ug/dL — ABNORMAL LOW (ref 42–145)
Saturation Ratios: 7 % — ABNORMAL LOW (ref 20.0–50.0)
Transferrin: 236 mg/dL (ref 212.0–360.0)

## 2015-11-26 ENCOUNTER — Encounter: Payer: Self-pay | Admitting: Gastroenterology

## 2015-11-26 LAB — HEMOGLOBINOPATHY EVALUATION
HEMOGLOBIN A2 QUANTITATION: 2.6 % (ref 0.7–3.1)
HEMOGLOBIN F QUANTITATION: 0 % (ref 0.0–2.0)
HGB A: 97.4 % (ref 94.0–98.0)
HGB C: 0 %
HGB S: 0 %

## 2015-11-26 LAB — FERRITIN: Ferritin: 23 ng/mL (ref 10.0–291.0)

## 2015-11-30 ENCOUNTER — Encounter: Payer: Self-pay | Admitting: Gastroenterology

## 2015-12-06 ENCOUNTER — Encounter: Payer: Self-pay | Admitting: *Deleted

## 2015-12-10 ENCOUNTER — Ambulatory Visit (INDEPENDENT_AMBULATORY_CARE_PROVIDER_SITE_OTHER): Payer: BLUE CROSS/BLUE SHIELD | Admitting: Obstetrics

## 2015-12-10 ENCOUNTER — Encounter: Payer: Self-pay | Admitting: *Deleted

## 2015-12-10 ENCOUNTER — Encounter: Payer: Self-pay | Admitting: Obstetrics

## 2015-12-10 VITALS — BP 114/75 | HR 81 | Temp 99.3°F | Ht 66.0 in | Wt 174.6 lb

## 2015-12-10 DIAGNOSIS — Z01419 Encounter for gynecological examination (general) (routine) without abnormal findings: Secondary | ICD-10-CM

## 2015-12-10 DIAGNOSIS — N341 Nonspecific urethritis: Secondary | ICD-10-CM

## 2015-12-10 DIAGNOSIS — Z1239 Encounter for other screening for malignant neoplasm of breast: Secondary | ICD-10-CM

## 2015-12-10 MED ORDER — METRONIDAZOLE 500 MG PO TABS: 500.0000 mg | ORAL_TABLET | Freq: Two times a day (BID) | ORAL | 2 refills | Status: AC

## 2015-12-10 MED ORDER — CIPROFLOXACIN HCL 500 MG PO TABS: 500.0000 mg | ORAL_TABLET | Freq: Two times a day (BID) | ORAL | 0 refills | Status: AC

## 2015-12-10 NOTE — Progress Notes (Signed)
Patient ID: Stacy Olson, female   DOB: Mar 13, 1976, 40 y.o.   MRN: [id number]   Subjective:        Stacy Olson is a 40 y.o. female here for a routine exam.  Current complaints: Heavy periods.  Anemia.  H/O a painful urethral " cyst " that was treated by a Urologist with Cipro, and it ruptured a few months ago.  Now the cyst is starting to enlarge again but is not painful.  Personal health questionnaire:  Is patient Ashkenazi Jewish, have a family history of breast and/or ovarian cancer: no Is there a family history of uterine cancer diagnosed at age < 69, gastrointestinal cancer, urinary tract cancer, family member who is a Field seismologist syndrome-associated carrier: no Is the patient overweight and hypertensive, family history of diabetes, personal history of gestational diabetes, preeclampsia or PCOS: no Is patient over 10, have PCOS,  family history of premature CHD under age 28, diabetes, smoke, have hypertension or peripheral artery disease:  no At any time, has a partner hit, kicked or otherwise hurt or frightened you?: no Over the past 2 weeks, have you felt down, depressed or hopeless?: no Over the past 2 weeks, have you felt little interest or pleasure in doing things?:no   Gynecologic History Patient's last menstrual period was 11/22/2015. Contraception: none Last Pap: unknown. Results were: normal Last mammogram: never. Results were: n/a  Obstetric History OB History  Gravida Para Term Preterm AB Living  3 3 3     3   SAB TAB Ectopic Multiple Live Births          2    # Outcome Date GA Lbr Len/2nd Weight Sex Delivery Anes PTL Lv  3 Term 12/10/11 [redacted]w[redacted]d  8 lb 9.6 oz (3.9 kg) F CS-LTranv Spinal  LIV  2 Term 06/2009     Vag-Spont   LIV  1 Term 10/2007     Vag-Spont  N       Past Medical History:  Diagnosis Date  . Anemia   . Urethrocele     Past Surgical History:  Procedure Laterality Date  . CESAREAN SECTION  12/10/2011   Procedure: CESAREAN SECTION;   Surgeon: Cyril Mourning, MD;  Location: [facility];  Service: Obstetrics;  Laterality: N/A;     Current Outpatient Prescriptions:  .  omeprazole (PRILOSEC) 40 MG capsule, Take 1 capsule (40 mg total) by mouth daily., Disp: 30 capsule, Rfl: 3 .  ciprofloxacin (CIPRO) 500 MG tablet, Take 1 tablet (500 mg total) by mouth 2 (two) times daily., Disp: 14 tablet, Rfl: 0 .  ferrous sulfate 325 (65 FE) MG tablet, Take 1 tablet (325 mg total) by mouth daily with breakfast., Disp: 30 tablet, Rfl: 2 .  metroNIDAZOLE (FLAGYL) 500 MG tablet, Take 1 tablet (500 mg total) by mouth 2 (two) times daily., Disp: 14 tablet, Rfl: 2 Allergies  Allergen Reactions  . Bactrim [Sulfamethoxazole-Trimethoprim]     Social History  Substance Use Topics  . Smoking status: Never Smoker  . Smokeless tobacco: Never Used  . Alcohol use No    Family History  Problem Relation Age of Onset  . Diabetes Mother   . Diabetes Father   . Hypertension Father   . Kidney failure Father   . Anesthesia problems Neg Hx   . Hypotension Neg Hx   . Malignant hyperthermia Neg Hx   . Pseudochol deficiency Neg Hx   . Other Neg Hx       Review of Systems  Constitutional:  negative for fatigue and weight loss Respiratory: negative for cough and wheezing Cardiovascular: negative for chest pain, fatigue and palpitations Gastrointestinal: negative for abdominal pain and change in bowel habits Musculoskeletal:negative for myalgias Neurological: negative for gait problems and tremors Behavioral/Psych: negative for abusive relationship, depression Endocrine: negative for temperature intolerance   Genitourinary:positive for abnormally heavy menstrual periods.  H/O urethral " cyst " Integument/breast: negative for breast lump, breast tenderness, nipple discharge and skin lesion(s)    Objective:       BP 114/75   Pulse 81   Temp 99.3 F (37.4 C)   Ht 5\' 6"  (1.676 m)   Wt 174 lb 9.6 oz (79.2 kg)   LMP 11/22/2015   BMI 28.18 kg/m   General:   alert  Skin:   no rash or abnormalities  Lungs:   clear to auscultation bilaterally  Heart:   regular rate and rhythm, S1, S2 normal, no murmur, click, rub or gallop  Breasts:   normal without suspicious masses, skin or nipple changes or axillary nodes  Abdomen:  normal findings: no organomegaly, soft, non-tender and no hernia  Pelvis:  External genitalia: normal general appearance Urinary system: urethral meatus is swollen, non tender. Bladder without fullness, nontender Vaginal: normal without tenderness, induration or masses Cervix: normal appearance Adnexa: normal bimanual exam Uterus: anteverted and non-tender, normal size   Lab Review Urine pregnancy test Labs reviewed yes Radiologic studies reviewed no  50% of 20 min visit spent on counseling and coordination of care.   Assessment:    Healthy female exam.    AUB  Urethritis  Contraceptive counseling and advice  H/O anemia   Plan:   Iron and PNV's dispensed OCP's dispensed ( [place] )  Education reviewed: calcium supplements, low fat, low cholesterol diet, self breast exams and weight bearing exercise. Contraception: OCP (estrogen/progesterone). Mammogram ordered. Follow up in: 8 weeks.   Meds ordered this encounter  Medications  . ciprofloxacin (CIPRO) 500 MG tablet    Sig: Take 1 tablet (500 mg total) by mouth 2 (two) times daily.    Dispense:  14 tablet    Refill:  0  . metroNIDAZOLE (FLAGYL) 500 MG tablet    Sig: Take 1 tablet (500 mg total) by mouth 2 (two) times daily.    Dispense:  14 tablet    Refill:  2   No orders of the defined types were placed in this encounter.

## 2015-12-11 ENCOUNTER — Telehealth: Payer: Self-pay | Admitting: Gastroenterology

## 2015-12-11 ENCOUNTER — Other Ambulatory Visit: Payer: Self-pay

## 2015-12-11 ENCOUNTER — Encounter (HOSPITAL_COMMUNITY): Payer: Self-pay | Admitting: *Deleted

## 2015-12-11 DIAGNOSIS — R1084 Generalized abdominal pain: Secondary | ICD-10-CM

## 2015-12-11 DIAGNOSIS — D649 Anemia, unspecified: Secondary | ICD-10-CM

## 2015-12-11 NOTE — Telephone Encounter (Signed)
Spoke with the patient about rescheduling her procedures. She will go to the [facility] tomorrow arriving at 7:30 am. Instructed to begin tomorrow prep at 4:00 am. NPO after 6:00 am.

## 2015-12-12 ENCOUNTER — Ambulatory Visit (HOSPITAL_COMMUNITY)
Admission: RE | Admit: 2015-12-12 | Discharge: 2015-12-12 | Disposition: A | Discharge status: Home / Self Care | Payer: BLUE CROSS/BLUE SHIELD | Source: Ambulatory Visit | Attending: Gastroenterology | Admitting: Gastroenterology

## 2015-12-12 ENCOUNTER — Ambulatory Visit (HOSPITAL_COMMUNITY): Payer: BLUE CROSS/BLUE SHIELD | Admitting: Anesthesiology

## 2015-12-12 ENCOUNTER — Encounter (HOSPITAL_COMMUNITY)
Admission: RE | Disposition: A | Discharge status: Home / Self Care | Payer: Self-pay | Source: Ambulatory Visit | Attending: Gastroenterology

## 2015-12-12 ENCOUNTER — Encounter: Payer: BLUE CROSS/BLUE SHIELD | Admitting: Gastroenterology

## 2015-12-12 ENCOUNTER — Encounter (HOSPITAL_COMMUNITY): Payer: Self-pay | Admitting: Anesthesiology

## 2015-12-12 DIAGNOSIS — K6289 Other specified diseases of anus and rectum: Secondary | ICD-10-CM | POA: Diagnosis not present

## 2015-12-12 DIAGNOSIS — K5909 Other constipation: Secondary | ICD-10-CM | POA: Insufficient documentation

## 2015-12-12 DIAGNOSIS — B9681 Helicobacter pylori [H. pylori] as the cause of diseases classified elsewhere: Secondary | ICD-10-CM | POA: Diagnosis not present

## 2015-12-12 DIAGNOSIS — K648 Other hemorrhoids: Secondary | ICD-10-CM | POA: Diagnosis not present

## 2015-12-12 DIAGNOSIS — K219 Gastro-esophageal reflux disease without esophagitis: Secondary | ICD-10-CM | POA: Diagnosis not present

## 2015-12-12 DIAGNOSIS — K2950 Unspecified chronic gastritis without bleeding: Secondary | ICD-10-CM | POA: Diagnosis not present

## 2015-12-12 DIAGNOSIS — D649 Anemia, unspecified: Secondary | ICD-10-CM

## 2015-12-12 DIAGNOSIS — R196 Halitosis: Secondary | ICD-10-CM | POA: Diagnosis present

## 2015-12-12 DIAGNOSIS — D509 Iron deficiency anemia, unspecified: Secondary | ICD-10-CM | POA: Diagnosis not present

## 2015-12-12 DIAGNOSIS — D125 Benign neoplasm of sigmoid colon: Secondary | ICD-10-CM

## 2015-12-12 DIAGNOSIS — R1084 Generalized abdominal pain: Secondary | ICD-10-CM

## 2015-12-12 HISTORY — PX: COLONOSCOPY: SHX5424

## 2015-12-12 HISTORY — PX: ESOPHAGOGASTRODUODENOSCOPY: SHX5428

## 2015-12-12 LAB — URINE CULTURE

## 2015-12-12 SURGERY — EGD (ESOPHAGOGASTRODUODENOSCOPY)
Anesthesia: Monitor Anesthesia Care

## 2015-12-12 MED ORDER — LIDOCAINE 2% (20 MG/ML) 5 ML SYRINGE
INTRAMUSCULAR | Status: DC | PRN
  Administered 2015-12-12: 100 mg via INTRAVENOUS

## 2015-12-12 MED ORDER — LIDOCAINE 2% (20 MG/ML) 5 ML SYRINGE
INTRAMUSCULAR | Status: AC
  Filled 2015-12-12: qty 5

## 2015-12-12 MED ORDER — LACTATED RINGERS IV SOLN
INTRAVENOUS | Status: DC
  Administered 2015-12-12: 1000 mL via INTRAVENOUS

## 2015-12-12 MED ORDER — PROPOFOL 500 MG/50ML IV EMUL
INTRAVENOUS | Status: DC | PRN
  Administered 2015-12-12: 140 ug/kg/min via INTRAVENOUS

## 2015-12-12 MED ORDER — PROPOFOL 10 MG/ML IV BOLUS
INTRAVENOUS | Status: DC | PRN
  Administered 2015-12-12 (×5): 20 mg via INTRAVENOUS

## 2015-12-12 MED ORDER — PROPOFOL 10 MG/ML IV BOLUS
INTRAVENOUS | Status: AC
  Filled 2015-12-12: qty 20

## 2015-12-12 MED ORDER — PROPOFOL 10 MG/ML IV BOLUS
INTRAVENOUS | Status: AC
  Filled 2015-12-12: qty 40

## 2015-12-12 MED ORDER — SODIUM CHLORIDE 0.9 % IV SOLN: INTRAVENOUS | Status: DC

## 2015-12-12 NOTE — Discharge Instructions (Signed)
YOU HAD AN ENDOSCOPIC PROCEDURE TODAY: Refer to the procedure report and other information in the discharge instructions given to you for any specific questions about what was found during the examination. If this information does not answer your questions, please call [place] office at [phone] to clarify.  ° °YOU SHOULD EXPECT: Some feelings of bloating in the abdomen. Passage of more gas than usual. Walking can help get rid of the air that was put into your GI tract during the procedure and reduce the bloating. If you had a lower endoscopy (such as a colonoscopy or flexible sigmoidoscopy) you may notice spotting of blood in your stool or on the toilet paper. Some abdominal soreness may be present for a day or two, also. ° °DIET: Your first meal following the procedure should be a light meal and then it is ok to progress to your normal diet. A half-sandwich or bowl of soup is an example of a good first meal. Heavy or fried foods are harder to digest and may make you feel nauseous or bloated. Drink plenty of fluids but you should avoid alcoholic beverages for 24 hours. If you had a esophageal dilation, please see attached instructions for diet.   ° °ACTIVITY: Your care partner should take you home directly after the procedure. You should plan to take it easy, moving slowly for the rest of the day. You can resume normal activity the day after the procedure however YOU SHOULD NOT DRIVE, use power tools, machinery or perform tasks that involve climbing or major physical exertion for 24 hours (because of the sedation medicines used during the test).  ° °SYMPTOMS TO REPORT IMMEDIATELY: °A gastroenterologist can be reached at any hour. Please call [phone]  for any of the following symptoms:  °Following lower endoscopy (colonoscopy, flexible sigmoidoscopy) °Excessive amounts of blood in the stool  °Significant tenderness, worsening of abdominal pains  °Swelling of the abdomen that is new, acute  °Fever of 100° or  higher  °Following upper endoscopy (EGD, EUS, ERCP, esophageal dilation) °Vomiting of blood or coffee ground material  °New, significant abdominal pain  °New, significant chest pain or pain under the shoulder blades  °Painful or persistently difficult swallowing  °New shortness of breath  °Black, tarry-looking or red, bloody stools ° °FOLLOW UP:  °If any biopsies were taken you will be contacted by phone or by letter within the next 1-3 weeks. Call [phone]  if you have not heard about the biopsies in 3 weeks.  °Please also call with any specific questions about appointments or follow up tests. ° °

## 2015-12-12 NOTE — Transfer of Care (Signed)
Immediate Anesthesia Transfer of Care Note  Patient: Stacy Olson  Procedure(s) Performed: Procedure(s): ESOPHAGOGASTRODUODENOSCOPY (EGD) (N/A) COLONOSCOPY (N/A)  Patient Location: Endoscopy Unit  Anesthesia Type:MAC  Level of Consciousness: awake and alert   Airway & Oxygen Therapy: Patient Spontanous Breathing  Post-op Assessment: Report given to RN and Post -op Vital signs reviewed and stable  Post vital signs: Reviewed and stable  Last Vitals:  Vitals:   12/12/15 0828 12/12/15 0836  BP:  126/80  Pulse:  69  Resp:  12  Temp: 36.9 C     Last Pain:  Vitals:   12/12/15 0836  TempSrc: Oral         Complications: No apparent anesthesia complications

## 2015-12-12 NOTE — Anesthesia Preprocedure Evaluation (Signed)
Anesthesia Evaluation  Patient identified by MRN, date of birth, ID band Patient awake    Reviewed: Allergy & Precautions, NPO status , Patient's Chart, lab work & pertinent test results  History of Anesthesia Complications Negative for: history of anesthetic complications  Airway Mallampati: I  TM Distance: >3 FB Neck ROM: Full    Dental  (+) Teeth Intact   Pulmonary neg pulmonary ROS,    breath sounds clear to auscultation       Cardiovascular negative cardio ROS   Rhythm:Regular     Neuro/Psych negative neurological ROS  negative psych ROS   GI/Hepatic Neg liver ROS,   Endo/Other  negative endocrine ROS  Renal/GU negative Renal ROS     Musculoskeletal   Abdominal   Peds  Hematology  (+) anemia ,   Anesthesia Other Findings   Reproductive/Obstetrics                             Anesthesia Physical Anesthesia Plan  ASA: II  Anesthesia Plan: MAC   Post-op Pain Management:    Induction: Intravenous  Airway Management Planned: Natural Airway, Nasal Cannula and Simple Face Mask  Additional Equipment: None  Intra-op Plan:   Post-operative Plan:   Informed Consent: I have reviewed the patients History and Physical, chart, labs and discussed the procedure including the risks, benefits and alternatives for the proposed anesthesia with the patient or authorized representative who has indicated his/her understanding and acceptance.   Dental advisory given  Plan Discussed with: CRNA and Surgeon  Anesthesia Plan Comments:         Anesthesia Quick Evaluation

## 2015-12-12 NOTE — Op Note (Signed)
[facility] Patient Name: Stacy Olson Procedure Date: 12/12/2015 MRN: [id number] Attending MD: Carlota Raspberry. Havery Moros , MD Date of Birth: Aug 08, 1975 CSN: [id number] Age: 40 Admit Type: Outpatient Procedure:                Upper GI endoscopy Indications:              40 y/o AA female with Iron deficiency anemia,                            hallitosis Providers:                Carlota Raspberry. Havery Moros, MD, Cleda Daub, RN, Despina Pole Tech, Technician, Danley Danker, CRNA Referring MD:              Medicines:                Monitored Anesthesia Care Complications:            No immediate complications. Estimated blood loss:                            Minimal. Estimated Blood Loss:     Estimated blood loss was minimal. Procedure:                Pre-Anesthesia Assessment:                           - Prior to the procedure, a History and Physical                            was performed, and patient medications and                            allergies were reviewed. The patient's tolerance of                            previous anesthesia was also reviewed. The risks                            and benefits of the procedure and the sedation                            options and risks were discussed with the patient.                            All questions were answered, and informed consent                            was obtained. Prior Anticoagulants: The patient has                            taken no previous anticoagulant or antiplatelet  agents. ASA Grade Assessment: II - A patient with                            mild systemic disease. After reviewing the risks                            and benefits, the patient was deemed in                            satisfactory condition to undergo the procedure.                           After obtaining informed consent, the endoscope was   passed under direct vision. Throughout the                            procedure, the patient's blood pressure, pulse, and                            oxygen saturations were monitored continuously. The                            Endoscope was introduced through the mouth, and                            advanced to the second part of duodenum. The upper                            GI endoscopy was accomplished without difficulty.                            The patient tolerated the procedure well. Scope In: Scope Out: Findings:      Esophagogastric landmarks were identified: the Z-line was found at 39       cm, the gastroesophageal junction was found at 39 cm and the upper       extent of the gastric folds was found at 39 cm from the incisors.      The exam of the esophagus was otherwise normal.      The entire examined stomach was normal. Biopsies were taken from the       antrum and body with a cold forceps for Helicobacter pylori testing.      The duodenal bulb and second portion of the duodenum were normal. Impression:               - Esophagogastric landmarks identified.                           - Normal stomach. Biopsied.                           - Normal duodenal bulb and second portion of the                            duodenum.  Overall, no cause for iron deficiency noted on this                            exam. Will await biopsies. Moderate Sedation:      No moderate sedation, case performed with MAC Recommendation:           - Patient has a contact number available for                            emergencies. The signs and symptoms of potential                            delayed complications were discussed with the                            patient. Return to normal activities tomorrow.                            Written discharge instructions were provided to the                            patient.                           - Resume previous diet.                            - Continue present medications.                           - Await pathology results.                           - Follow up with Gynecology for iron deficiency Procedure Code(s):        --- Professional ---                           [id number], Esophagogastroduodenoscopy, flexible,                            transoral; with biopsy, single or multiple Diagnosis Code(s):        --- Professional ---                           D50.9, Iron deficiency anemia, unspecified CPT copyright 2016 [employer]. All rights reserved. The codes documented in this report are preliminary and upon coder review may  be revised to meet current compliance requirements. Remo Lipps P. Armbruster, MD 12/12/2015 9:48:05 AM This report has been signed electronically. Number of Addenda: 0

## 2015-12-12 NOTE — Anesthesia Postprocedure Evaluation (Signed)
Anesthesia Post Note  Patient: Stacy Olson  Procedure(s) Performed: Procedure(s) (LRB): ESOPHAGOGASTRODUODENOSCOPY (EGD) (N/A) COLONOSCOPY (N/A)  Patient location during evaluation: Endoscopy Anesthesia Type: MAC Level of consciousness: awake Pain management: pain level controlled Vital Signs Assessment: post-procedure vital signs reviewed and stable Respiratory status: spontaneous breathing Cardiovascular status: stable Postop Assessment: no signs of nausea or vomiting Anesthetic complications: no    Last Vitals:  Vitals:   12/12/15 1000 12/12/15 1010  BP: 130/83 107/65  Pulse: 66 62  Resp: 18 16  Temp:      Last Pain:  Vitals:   12/12/15 0836  TempSrc: Oral                 Goro Wenrick

## 2015-12-12 NOTE — H&P (View-Only) (Signed)
HPI :  INTAKE on 11/07/15: 40 y/o female with PMH as outlined below presenting for a new patient visit for subjective symptoms of bad breath and reflux.   She reports bad breath which has bothered her a long time. She reports when brushing her teeth she endorses "mucous build up" in her throat which can build up several times per day. She was given a trial of Zyrtec which did not help too much.  She states she was seen by ENT and had a negative laryngoscopy, reported no issues with the sinuses or tonsils. She was given a course of Clindamycin and Zantac. She was given a 30 day supply of clarithromycin, taking it only PRN and "as needed". She did not take the full course. She is not sure if the antibiotic had helped too much. She reports altered taste in her mouth.   She has some occasional heartburn. She denies any regurgitation. She has some occasional belching. No dysphagia. No vomiting. No routine or chronic abdominal pains. Eating well. No blood in the stools. She reports abnormal stools when taking clindamycin.   She denies use of retainers of dentures. She reports a very strong sense of smell, and she thinks subjectively she has bad breath and has had others tell her the same. No tooth or mouth pain. She denies history of tonsillectomy. She has seen a dentist about 2 months ago, states no issues with her teeth. She has tried using mouthwash.   She has a history of anemia. She reports a history of menorrhagia which is thought to have caused this in the past, seen by GYN.   SINCE LAST VISIT:  Patient called for an acute visit, she reports today she is requesting an EGD and colonoscopy.   Since the last visit we placed her on a trial of omeprazole 40mg . She reports taking it once daily, and she does not think she has noticed any difference in her symptoms. She reports she continues to have some stable reflux symptoms. No dysphagia. She is having some belching.   She otherwise endorses  chronic constipation. She is having a bowel movement once every 3-4 days. Passing hard stools which is her baseline. She denies any blood in the stools at baseline. Rare blood on the tissue with wipes.  She also endorses pain in her rectum with some bowel movements which can be severe. It is not in her anal area but "deeper inside" in her rectum which she did not endorse at the last visit. She is requesting an endoscopic evaluation for her iron deficiency.   She is taking iron supplement for longstanding iron deficiency. She reports longstanding heavy menstrual cycles. Menses occur monthly and last 4 days, she states very heavy the first 2 days. She uses roughly 30 pads over 4 days. She takes 325mg  iron sulfate for a long time which makes her stools dark.   No FH of colon cancer. She additionally reports a lot of fatigue.   Upon asking if she had any other questions at the end of the visit she became quite tearful. She is requesting EGD and colonoscopy for piece of mind, has a lot of anxiety about these symptoms, and also anxious to see a gynecologist for her other symptoms. She reports a bad experience with other providers she has seen and asking for recommended providers.   Past Medical History:  Diagnosis Date  . Anemia   . Urethrocele      Past Surgical History:  Procedure Laterality Date  .  CESAREAN SECTION  12/10/2011   Procedure: CESAREAN SECTION;  Surgeon: Cyril Mourning, MD;  Location: [facility];  Service: Obstetrics;  Laterality: N/A;   Family History  Problem Relation Age of Onset  . Diabetes Mother   . Diabetes Father   . Hypertension Father   . Kidney failure Father   . Anesthesia problems Neg Hx   . Hypotension Neg Hx   . Malignant hyperthermia Neg Hx   . Pseudochol deficiency Neg Hx   . Other Neg Hx    Social History  Substance Use Topics  . Smoking status: Never Smoker  . Smokeless tobacco: Never Used  . Alcohol use No   Current Outpatient Prescriptions    Medication Sig Dispense Refill  . ferrous sulfate 325 (65 FE) MG tablet Take 1 tablet (325 mg total) by mouth daily with breakfast. 30 tablet 2  . omeprazole (PRILOSEC) 40 MG capsule Take 1 capsule (40 mg total) by mouth daily. 30 capsule 3   No current facility-administered medications for this visit.    Allergies  Allergen Reactions  . Bactrim [Sulfamethoxazole-Trimethoprim]      Review of Systems: All systems reviewed and negative except where noted in HPI.   Lab Results  Component Value Date   WBC 7.7 11/22/2015   HGB 10.9 (L) 11/22/2015   HCT 34.0 (L) 11/22/2015   MCV 66.7 Repeated and verified X2. (L) 11/22/2015   PLT 162.0 11/22/2015   CBC Latest Ref Rng & Units 11/22/2015 09/13/2012 03/21/2012  WBC 4.0 - 10.5 K/uL 7.7 6.4 4.5  Hemoglobin 12.0 - 15.0 g/dL 10.9(L) 10.8(L) 10.6(L)  Hematocrit 36.0 - 46.0 % 34.0(L) 33.3(L) 32.6(L)  Platelets 150.0 - 400.0 K/uL 162.0 160 163      Lab Results  Component Value Date   CREATININE 0.78 11/07/2015   BUN 9 11/07/2015   NA 138 11/07/2015   K 3.7 11/07/2015   CL 105 11/07/2015   CO2 29 11/07/2015    Lab Results  Component Value Date   ALT 11 11/07/2015   AST 12 11/07/2015   ALKPHOS 80 11/07/2015   BILITOT 0.5 11/07/2015     Physical Exam: BP 100/70 (BP Location: Left Arm, Patient Position: Sitting, Cuff Size: Normal)   Pulse 72   Ht 5\' 6"  (1.676 m)   Wt 176 lb 6 oz (80 kg)   LMP 11/22/2015   BMI 28.47 kg/m  Constitutional: Pleasant,well-developed, female in no acute distress. HEENT: Normocephalic and atraumatic. Conjunctivae are normal. No scleral icterus. Neck supple.  Cardiovascular: Normal rate, regular rhythm.  Pulmonary/chest: Effort normal and breath sounds normal. No wheezing, rales or rhonchi. Abdominal: Soft, nondistended,mildly protuberant, nontender. Bowel sounds active throughout. There are no masses palpable.  Extremities: no edema Lymphadenopathy: No cervical adenopathy noted. Neurological:  Alert and oriented to person place and time. Skin: Skin is warm and dry. No rashes noted. Psychiatric: Normal mood and affect. Behavior is normal.   ASSESSMENT AND PLAN: 40 y/o female here for follow up for the following issues:  Anemia - longstanding microcytosis with reported iron deficiency, per patient ongoing for years. Based on symptoms seems very likely related to menorrhagia. She has chronic constipation without any recent changes, but does endorse some rectal discomfort periodically. She was tearful in her interactions today, is quite anxious about this issue, and voiced strongly her desire for EGD and colonoscopy for "piece of mind". While I reassured her I it's less likely her GI tract is driving this process, I offered her the exams, and  to evaluate her rectal discomfort, and discussed the risks / benefits of each with her. I do think it is more likely her anemia is being driven by her menorrhagia and recommended to her that she follow up with a gynecologist. Checked CBC to ensure stable today given her fatigue, and it is. She agreed.   Rectal pain - as above, will await colonoscopy, treat constipation  Constipation - recommend miralax daily to help with this, suspect being driven by longstanding iron suppplementation  Hallitosis - see prior note regarding full discussion of this issue. I don't think related to her GI tract but will await EGD. More than likely this is related to the oral cavity. Recommend flossing daily and using mouth wash twice daily, and to follow up with dentist as needed. CMP normal.   Bonduel Cellar, MD [facility] Pager [phone]

## 2015-12-12 NOTE — Interval H&P Note (Signed)
History and Physical Interval Note:  12/12/2015 8:19 AM  Stacy Olson  has presented today for surgery, with the diagnosis of anemia abdominal pain epigastric pain  The various methods of treatment have been discussed with the patient and family. After consideration of risks, benefits and other options for treatment, the patient has consented to  Procedure(s): ESOPHAGOGASTRODUODENOSCOPY (EGD) (N/A) COLONOSCOPY (N/A) as a surgical intervention .  The patient's history has been reviewed, patient examined, no change in status, stable for surgery.  I have reviewed the patient's chart and labs.  Questions were answered to the patient's satisfaction.     Renelda Loma Corrigan Kretschmer

## 2015-12-12 NOTE — Op Note (Signed)
[facility] Patient Name: Stacy Olson Procedure Date: 12/12/2015 MRN: [id number] Attending MD: Carlota Raspberry. Havery Moros , MD Date of Birth: 05/14/1975 CSN: [id number] Age: 40 Admit Type: Outpatient Procedure:                Colonoscopy Indications:              Unexplained iron deficiency anemia, rectal                            discomfort, constipation Providers:                Carlota Raspberry. Havery Moros, MD, Cleda Daub, RN, Despina Pole Tech, Technician, Danley Danker, CRNA Referring MD:              Medicines:                Monitored Anesthesia Care Complications:            No immediate complications. Estimated blood loss:                            Minimal. Estimated Blood Loss:     Estimated blood loss was minimal. Procedure:                Pre-Anesthesia Assessment:                           - Prior to the procedure, a History and Physical                            was performed, and patient medications and                            allergies were reviewed. The patient's tolerance of                            previous anesthesia was also reviewed. The risks                            and benefits of the procedure and the sedation                            options and risks were discussed with the patient.                            All questions were answered, and informed consent                            was obtained. Prior Anticoagulants: The patient has                            taken no previous anticoagulant or antiplatelet                            agents. ASA Grade  Assessment: II - A patient with                            mild systemic disease. After reviewing the risks                            and benefits, the patient was deemed in                            satisfactory condition to undergo the procedure.                           After obtaining informed consent, the colonoscope                            was  passed under direct vision. Throughout the                            procedure, the patient's blood pressure, pulse, and                            oxygen saturations were monitored continuously. The                            [id number] scope was introduced through                            the anus and advanced to the the terminal ileum,                            with identification of the appendiceal orifice and                            IC valve. The colonoscopy was performed without                            difficulty. The patient tolerated the procedure                            well. The quality of the bowel preparation was                            good. The terminal ileum, ileocecal valve,                            appendiceal orifice, and rectum were photographed. Scope In: 9:18:57 AM Scope Out: 9:36:28 AM Scope Withdrawal Time: 0 hours 14 minutes 42 seconds  Total Procedure Duration: 0 hours 17 minutes 31 seconds  Findings:      The perianal and digital rectal examinations were normal.      A 5 mm polyp was found in the sigmoid colon. The polyp was sessile. The       polyp was removed with a cold snare. Resection and retrieval were       complete.  The terminal ileum appeared normal.      Non-bleeding internal hemorrhoids were found during retroflexion.      The exam was otherwise without abnormality. Impression:               - One 5 mm polyp in the sigmoid colon, removed with                            a cold snare. Resected and retrieved.                           - The examined portion of the ileum was normal.                           - Non-bleeding internal hemorrhoids.                           - The examination was otherwise normal.                           Overall, no cause for iron deficiency on this exam.                            Suspect menorrhagia is driving that process.                            Hemorrhoids may explain periodic rectal  discomfort. Moderate Sedation:      No moderate sedation, case performed with MAC Recommendation:           - Patient has a contact number available for                            emergencies. The signs and symptoms of potential                            delayed complications were discussed with the                            patient. Return to normal activities tomorrow.                            Written discharge instructions were provided to the                            patient.                           - Resume previous diet.                           - Continue present medications (miralax for                            constipation)                           - No aspirin,  ibuprofen, naproxen, or other                            non-steroidal anti-inflammatory drugs for 2 weeks                            after polyp removal.                           - Await pathology results.                           - Repeat colonoscopy is recommended for                            surveillance. The colonoscopy date will be                            determined after pathology results from today's                            exam become available for review.                           - Follow up with Gynecology for heavy menstrual                            cycles which is the most likely cause for your                            anemia. Procedure Code(s):        --- Professional ---                           [phone], Colonoscopy, flexible; with removal of                            tumor(s), polyp(s), or other lesion(s) by snare                            technique Diagnosis Code(s):        --- Professional ---                           D12.5, Benign neoplasm of sigmoid colon                           K64.8, Other hemorrhoids                           D50.9, Iron deficiency anemia, unspecified CPT copyright 2016 [employer] Medical Association. All rights reserved. The codes documented in this  report are preliminary and upon coder review may  be revised to meet current compliance requirements. Remo Lipps P. Armbruster, MD 12/12/2015 9:43:37 AM This report has been signed electronically. Number of Addenda: 0

## 2015-12-13 ENCOUNTER — Encounter (HOSPITAL_COMMUNITY): Payer: Self-pay | Admitting: Gastroenterology

## 2015-12-13 ENCOUNTER — Other Ambulatory Visit: Payer: Self-pay

## 2015-12-13 DIAGNOSIS — A048 Other specified bacterial intestinal infections: Secondary | ICD-10-CM

## 2015-12-13 LAB — PAP IG AND HPV HIGH-RISK
HPV, HIGH-RISK: NEGATIVE
PAP Smear Comment: 0

## 2015-12-13 MED ORDER — BIS SUBCIT-METRONID-TETRACYC 140-125-125 MG PO CAPS: 3.0000 | ORAL_CAPSULE | Freq: Three times a day (TID) | ORAL | 0 refills | Status: AC

## 2015-12-14 LAB — NUSWAB VG+, CANDIDA 6SP
CANDIDA ALBICANS, NAA: NEGATIVE
CANDIDA GLABRATA, NAA: NEGATIVE
CANDIDA KRUSEI, NAA: NEGATIVE
CANDIDA LUSITANIAE, NAA: NEGATIVE
Candida parapsilosis, NAA: NEGATIVE
Candida tropicalis, NAA: POSITIVE — AB
Chlamydia trachomatis, NAA: NEGATIVE
Neisseria gonorrhoeae, NAA: NEGATIVE
Trich vag by NAA: NEGATIVE

## 2015-12-15 ENCOUNTER — Other Ambulatory Visit: Payer: Self-pay | Admitting: Obstetrics

## 2015-12-15 DIAGNOSIS — B373 Candidiasis of vulva and vagina: Secondary | ICD-10-CM

## 2015-12-15 DIAGNOSIS — B3731 Acute candidiasis of vulva and vagina: Secondary | ICD-10-CM

## 2015-12-15 MED ORDER — FLUCONAZOLE 150 MG PO TABS: 150.0000 mg | ORAL_TABLET | Freq: Once | ORAL | 2 refills | Status: AC

## 2015-12-19 ENCOUNTER — Telehealth: Payer: Self-pay | Admitting: *Deleted

## 2015-12-19 NOTE — Telephone Encounter (Signed)
Patient made aware of lab result and has taken her medications.

## 2016-02-04 ENCOUNTER — Ambulatory Visit: Payer: Self-pay | Admitting: Obstetrics

## 2016-02-04 ENCOUNTER — Ambulatory Visit: Payer: BLUE CROSS/BLUE SHIELD | Admitting: Obstetrics

## 2016-02-26 ENCOUNTER — Ambulatory Visit (INDEPENDENT_AMBULATORY_CARE_PROVIDER_SITE_OTHER): Payer: BLUE CROSS/BLUE SHIELD | Admitting: Obstetrics

## 2016-02-26 ENCOUNTER — Encounter: Payer: Self-pay | Admitting: Obstetrics

## 2016-02-26 VITALS — BP 108/79 | HR 90 | Temp 98.0°F | Wt 176.4 lb

## 2016-02-26 DIAGNOSIS — L02416 Cutaneous abscess of left lower limb: Secondary | ICD-10-CM | POA: Diagnosis not present

## 2016-02-26 DIAGNOSIS — N939 Abnormal uterine and vaginal bleeding, unspecified: Secondary | ICD-10-CM

## 2016-02-26 DIAGNOSIS — N810 Urethrocele: Secondary | ICD-10-CM | POA: Diagnosis not present

## 2016-02-26 MED ORDER — AMOXICILLIN-POT CLAVULANATE 875-125 MG PO TABS: 1.0000 | ORAL_TABLET | Freq: Two times a day (BID) | ORAL | 1 refills | Status: AC

## 2016-02-26 MED ORDER — NORETHIN ACE-ETH ESTRAD-FE 1-20 MG-MCG(24) PO CAPS: 1.0000 | ORAL_CAPSULE | Freq: Every day | ORAL | 99 refills | Status: AC

## 2016-02-26 NOTE — Progress Notes (Signed)
Patient ID: Stacy Olson, female   DOB: 1975-12-16, 40 y.o.   MRN: [id number]  Chief Complaint  Patient presents with  . Follow-up    cyst    HPI Stacy Olson is a 40 y.o. female.  History of AUB.  Presents for follow up after starting OCP's.  States periods have improved with pain but still heavy for 2-3 days.  The urethrocele enlarged and ruptured since last visit.  Now has skin abscess on LLE that is poorly healing. HPI  Past Medical History:  Diagnosis Date  . Anemia   . Urethrocele     Past Surgical History:  Procedure Laterality Date  . CESAREAN SECTION  12/10/2011   Procedure: CESAREAN SECTION;  Surgeon: Cyril Mourning, MD;  Location: [facility];  Service: Obstetrics;  Laterality: N/A;  . COLONOSCOPY N/A 12/12/2015   Procedure: COLONOSCOPY;  Surgeon: Manus Gunning, MD;  Location: [facility];  Service: Gastroenterology;  Laterality: N/A;  . ESOPHAGOGASTRODUODENOSCOPY N/A 12/12/2015   Procedure: ESOPHAGOGASTRODUODENOSCOPY (EGD);  Surgeon: Manus Gunning, MD;  Location: [facility];  Service: Gastroenterology;  Laterality: N/A;    Family History  Problem Relation Age of Onset  . Diabetes Mother   . Diabetes Father   . Hypertension Father   . Kidney failure Father   . Anesthesia problems Neg Hx   . Hypotension Neg Hx   . Malignant hyperthermia Neg Hx   . Pseudochol deficiency Neg Hx   . Other Neg Hx     Social History Social History  Substance Use Topics  . Smoking status: Never Smoker  . Smokeless tobacco: Never Used  . Alcohol use No    Allergies  Allergen Reactions  . Bactrim [Sulfamethoxazole-Trimethoprim]     Current Outpatient Prescriptions  Medication Sig Dispense Refill  . omeprazole (PRILOSEC) 40 MG capsule Take 1 capsule (40 mg total) by mouth daily. 30 capsule 3  . amoxicillin-clavulanate (AUGMENTIN) 875-125 MG tablet Take 1 tablet by mouth 2 (two) times daily. 28 tablet 1  . bismuth-metronidazole-tetracycline  (PYLERA) 140-125-125 MG capsule Take 3 capsules by mouth 4 (four) times daily -  before meals and at bedtime. (Patient not taking: Reported on 02/26/2016) 120 capsule 0  . ciprofloxacin (CIPRO) 500 MG tablet Take 1 tablet (500 mg total) by mouth 2 (two) times daily. (Patient not taking: Reported on 02/26/2016) 14 tablet 0  . ferrous sulfate 325 (65 FE) MG tablet Take 1 tablet (325 mg total) by mouth daily with breakfast. 30 tablet 2  . metroNIDAZOLE (FLAGYL) 500 MG tablet Take 1 tablet (500 mg total) by mouth 2 (two) times daily. (Patient not taking: Reported on 02/26/2016) 14 tablet 2   No current facility-administered medications for this visit.     Review of Systems Review of Systems Constitutional: negative for fatigue and weight loss Respiratory: negative for cough and wheezing Cardiovascular: negative for chest pain, fatigue and palpitations Gastrointestinal: negative for abdominal pain and change in bowel habits Genitourinary:positive for heavy and painful periods and a recurrent infected urethrocele Integument/breast: positive for poorly healing skin abscess LLE Musculoskeletal:negative for myalgias Neurological: negative for gait problems and tremors Behavioral/Psych: negative for abusive relationship, depression Endocrine: negative for temperature intolerance      Blood pressure 108/79, pulse 90, temperature 98 F (36.7 C), temperature source Oral, weight 176 lb 6.4 oz (80 kg), last menstrual period 02/05/2016.  Physical Exam Physical Exam           General:  Alert and no distress  Extremities:  Left lower Leg:  Small skin abscess  Abdomen:  normal findings: no organomegaly, soft, non-tender and no hernia  Pelvis:  External genitalia: normal general appearance Urinary system: urethral meatus normal and bladder without fullness, nontender Vaginal: normal without tenderness, induration or masses Cervix: normal appearance Adnexa: normal bimanual exam Uterus:  anteverted and non-tender, normal size     50% of 15 min visit spent on counseling and coordination of care.    Data Reviewed CBC  Assessment     AUB Urethrocele, recurrent infections Cutaneous abscess of left lower extremity    Plan    Continue OCP's for cycle regulation.  Stacy Olson dispensed, 3 packs.  Ultrasound ordered. Referred to Urogynecology for Urethrocele Augmentin Rx Follow up in 3 months  Orders Placed This Encounter  Procedures  . US Pelvis Complete    Standing Status:   Future    Standing Expiration Date:   04/27/2017    Order Specific Question:   Reason for Exam (SYMPTOM  OR DIAGNOSIS REQUIRED)    Answer:   AUB    Order Specific Question:   Preferred imaging location?    Answer:   [facility]  . US Transvaginal Non-OB    Standing Status:   Future    Standing Expiration Date:   04/27/2017    Order Specific Question:   Reason for Exam (SYMPTOM  OR DIAGNOSIS REQUIRED)    Answer:   AUB    Order Specific Question:   Preferred imaging location?    Answer:   [facility]  . Ambulatory referral to Urogynecology    Referral Priority:   Routine    Referral Type:   Consultation    Referral Reason:   Specialty Services Required    Requested Specialty:   Urology    Number of Visits Requested:   1   Meds ordered this encounter  Medications  . amoxicillin-clavulanate (AUGMENTIN) 875-125 MG tablet    Sig: Take 1 tablet by mouth 2 (two) times daily.    Dispense:  28 tablet    Refill:  1

## 2016-03-04 ENCOUNTER — Ambulatory Visit (HOSPITAL_COMMUNITY)
Admission: RE | Admit: 2016-03-04 | Discharge: 2016-03-04 | Disposition: A | Discharge status: Home / Self Care | Payer: BLUE CROSS/BLUE SHIELD | Source: Ambulatory Visit | Attending: Obstetrics | Admitting: Obstetrics

## 2016-03-04 DIAGNOSIS — N939 Abnormal uterine and vaginal bleeding, unspecified: Secondary | ICD-10-CM | POA: Diagnosis present

## 2016-03-05 ENCOUNTER — Other Ambulatory Visit: Payer: Self-pay | Admitting: Obstetrics

## 2016-06-10 ENCOUNTER — Encounter: Payer: Self-pay | Admitting: Obstetrics

## 2016-06-10 ENCOUNTER — Ambulatory Visit (INDEPENDENT_AMBULATORY_CARE_PROVIDER_SITE_OTHER): Payer: BLUE CROSS/BLUE SHIELD | Admitting: Obstetrics

## 2016-06-10 VITALS — BP 115/79 | HR 82 | Ht 66.0 in | Wt 184.0 lb

## 2016-06-10 DIAGNOSIS — N939 Abnormal uterine and vaginal bleeding, unspecified: Secondary | ICD-10-CM

## 2016-06-10 DIAGNOSIS — D500 Iron deficiency anemia secondary to blood loss (chronic): Secondary | ICD-10-CM

## 2016-06-10 DIAGNOSIS — Z0000 Encounter for general adult medical examination without abnormal findings: Secondary | ICD-10-CM

## 2016-06-10 NOTE — Progress Notes (Signed)
Pt present for f/u AUB. U/S completed 03/05/16: shows 2.2 x 0.9 x 0.3 cm heterogeneous density is noted within the endometrial canal. This could represent a focal endometrial lesion including tumor.

## 2016-06-10 NOTE — Progress Notes (Signed)
Patient ID: Stacy Olson, female   DOB: 02-21-1976, 41 y.o.   MRN: [id number]  Chief Complaint  Patient presents with  . Follow-up    HPI Stacy Olson is a 41 y.o. female.  Patient presents for follow up for AUB and Dysmenorrhea.  She was started on OCP'S and ultrasound ordered.  She states that cycles much lighter after starting OCP'S and very little discomfort.  Ultrasound was compromised by being on her period at the time of ultrasound, but the endometrial thickness was 4.3 mm with blood and possibly clot or polyp in the canal.  Will need sonohysterogram to further assess the endometrial if clinically necessary. HPI  Past Medical History:  Diagnosis Date  . Anemia   . Urethrocele     Past Surgical History:  Procedure Laterality Date  . CESAREAN SECTION  12/10/2011   Procedure: CESAREAN SECTION;  Surgeon: Cyril Mourning, MD;  Location: [facility];  Service: Obstetrics;  Laterality: N/A;  . COLONOSCOPY N/A 12/12/2015   Procedure: COLONOSCOPY;  Surgeon: Manus Gunning, MD;  Location: [facility];  Service: Gastroenterology;  Laterality: N/A;  . ESOPHAGOGASTRODUODENOSCOPY N/A 12/12/2015   Procedure: ESOPHAGOGASTRODUODENOSCOPY (EGD);  Surgeon: Manus Gunning, MD;  Location: [facility];  Service: Gastroenterology;  Laterality: N/A;    Family History  Problem Relation Age of Onset  . Diabetes Mother   . Diabetes Father   . Hypertension Father   . Kidney failure Father   . Anesthesia problems Neg Hx   . Hypotension Neg Hx   . Malignant hyperthermia Neg Hx   . Pseudochol deficiency Neg Hx   . Other Neg Hx     Social History Social History  Substance Use Topics  . Smoking status: Never Smoker  . Smokeless tobacco: Never Used  . Alcohol use No    Allergies  Allergen Reactions  . Bactrim [Sulfamethoxazole-Trimethoprim]     Current Outpatient Prescriptions  Medication Sig Dispense Refill  . iron polysaccharides (NIFEREX) 150 MG capsule Take by  mouth.    . Norethin Ace-Eth Estrad-FE (TAYTULLA) 1-20 MG-MCG(24) CAPS Take 1 capsule by mouth daily. 28 capsule prn  . ranitidine (ZANTAC) 150 MG capsule     . amoxicillin-clavulanate (AUGMENTIN) 875-125 MG tablet Take 1 tablet by mouth 2 (two) times daily. (Patient not taking: Reported on 06/10/2016) 28 tablet 1  . bismuth-metronidazole-tetracycline (PYLERA) 140-125-125 MG capsule Take 3 capsules by mouth 4 (four) times daily -  before meals and at bedtime. (Patient not taking: Reported on 02/26/2016) 120 capsule 0  . ciprofloxacin (CIPRO) 500 MG tablet Take 1 tablet (500 mg total) by mouth 2 (two) times daily. (Patient not taking: Reported on 02/26/2016) 14 tablet 0  . ferrous sulfate 325 (65 FE) MG tablet Take 1 tablet (325 mg total) by mouth daily with breakfast. 30 tablet 2  . metroNIDAZOLE (FLAGYL) 500 MG tablet Take 1 tablet (500 mg total) by mouth 2 (two) times daily. (Patient not taking: Reported on 02/26/2016) 14 tablet 2  . Norethin Ace-Eth Estrad-FE 1-20 MG-MCG(24) CAPS Take by mouth.    Marland Kitchen omeprazole (PRILOSEC) 40 MG capsule Take 1 capsule (40 mg total) by mouth daily. (Patient not taking: Reported on 06/10/2016) 30 capsule 3   No current facility-administered medications for this visit.     Review of Systems Review of Systems Constitutional: negative for fatigue and weight loss Respiratory: negative for cough and wheezing Cardiovascular: negative for chest pain, fatigue and palpitations Gastrointestinal: negative for abdominal pain and change in bowel habits Genitourinary:positive for  painful and heavy periods but improved on OCP's.  Positive for recurrent skene's duct cyst Integument/breast: negative for nipple discharge Musculoskeletal:negative for myalgias Neurological: negative for gait problems and tremors Behavioral/Psych: negative for abusive relationship, depression Endocrine: negative for temperature intolerance      Blood pressure 115/79, pulse 82, height 5\' 6"  (1.676  m), weight 184 lb (83.5 kg), last menstrual period 05/29/2016.  Physical Exam Physical Exam:  Deferred >50% of 10 min visit spent on counseling and coordination of care.    Data Reviewed Ultrasound CBC  Assessment     AUB / Dysmenorrhea.  Much improved on OCP's.  ? Endometrial clot vs polyp.  Most likely clot since she was on a heavy period at time of ultrasound.  Will follow clinically. History of recurrent skene's duct cyst.  Being followed by Urogynecology.  Surgery recommended.     Plan    Continue OCP's  Continue follow up with Urogynecology  Follow up in October for pap smear.  Orders Placed This Encounter  Procedures  . CBC  . Ambulatory referral to Internal Medicine    Referral Priority:   Routine    Referral Type:   Consultation    Referral Reason:   Specialty Services Required    Requested Specialty:   Internal Medicine    Number of Visits Requested:   1   Meds ordered this encounter  Medications  . iron polysaccharides (NIFEREX) 150 MG capsule    Sig: Take by mouth.  . Norethin Ace-Eth Estrad-FE 1-20 MG-MCG(24) CAPS    Sig: Take by mouth.  . ranitidine (ZANTAC) 150 MG capsule

## 2016-06-11 LAB — CBC
HEMATOCRIT: 33.5 % — AB (ref 34.0–46.6)
HEMOGLOBIN: 9.9 g/dL — AB (ref 11.1–15.9)
MCH: 20.3 pg — AB (ref 26.6–33.0)
MCHC: 29.6 g/dL — AB (ref 31.5–35.7)
MCV: 69 fL — AB (ref 79–97)
Platelets: 196 10*3/uL (ref 150–379)
RBC: 4.88 x10E6/uL (ref 3.77–5.28)
RDW: 15.2 % (ref 12.3–15.4)
WBC: 6.6 10*3/uL (ref 3.4–10.8)

## 2017-02-22 ENCOUNTER — Encounter (HOSPITAL_COMMUNITY): Payer: Self-pay | Admitting: Emergency Medicine

## 2017-02-22 ENCOUNTER — Emergency Department (HOSPITAL_COMMUNITY)
Admission: EM | Admit: 2017-02-22 | Discharge: 2017-02-22 | Disposition: A | Discharge status: Home / Self Care | Payer: BLUE CROSS/BLUE SHIELD | Attending: Emergency Medicine | Admitting: Emergency Medicine

## 2017-02-22 DIAGNOSIS — S3992XA Unspecified injury of lower back, initial encounter: Secondary | ICD-10-CM | POA: Diagnosis present

## 2017-02-22 DIAGNOSIS — Y929 Unspecified place or not applicable: Secondary | ICD-10-CM | POA: Insufficient documentation

## 2017-02-22 DIAGNOSIS — Y999 Unspecified external cause status: Secondary | ICD-10-CM | POA: Diagnosis not present

## 2017-02-22 DIAGNOSIS — X509XXA Other and unspecified overexertion or strenuous movements or postures, initial encounter: Secondary | ICD-10-CM | POA: Insufficient documentation

## 2017-02-22 DIAGNOSIS — S39012A Strain of muscle, fascia and tendon of lower back, initial encounter: Secondary | ICD-10-CM

## 2017-02-22 DIAGNOSIS — Z79899 Other long term (current) drug therapy: Secondary | ICD-10-CM | POA: Diagnosis not present

## 2017-02-22 DIAGNOSIS — Y93E1 Activity, personal bathing and showering: Secondary | ICD-10-CM | POA: Insufficient documentation

## 2017-02-22 MED ORDER — KETOROLAC TROMETHAMINE 60 MG/2ML IM SOLN
60.0000 mg | Freq: Once | INTRAMUSCULAR | Status: AC
  Administered 2017-02-22: 60 mg via INTRAMUSCULAR
  Filled 2017-02-22: qty 2

## 2017-02-22 MED ORDER — OXYCODONE-ACETAMINOPHEN 5-325 MG PO TABS
2.0000 | ORAL_TABLET | Freq: Once | ORAL | Status: AC
  Administered 2017-02-22: 2 via ORAL
  Filled 2017-02-22: qty 2

## 2017-02-22 MED ORDER — HYDROCODONE-ACETAMINOPHEN 5-325 MG PO TABS: 1.0000 | ORAL_TABLET | Freq: Four times a day (QID) | ORAL | 0 refills | Status: AC | PRN

## 2017-02-22 MED ORDER — NAPROXEN 500 MG PO TABS: 500.0000 mg | ORAL_TABLET | Freq: Two times a day (BID) | ORAL | 0 refills | Status: AC

## 2017-02-22 NOTE — Discharge Instructions (Signed)
Naproxen as prescribed.  Hydrocodone as prescribed as needed for pain not relieved with naproxen.  Follow-up with your primary doctor if you are not improving in the next week to discuss imaging studies or referrals to physical therapy.

## 2017-02-22 NOTE — ED Triage Notes (Signed)
Reports being on her knees while washing her hair. Upon standing up has low back pain.  Worse with bending over.  Rating at 9/10.  Took tylenol 1000mg  with no relief.

## 2017-02-22 NOTE — ED Provider Notes (Signed)
[facility] EMERGENCY DEPARTMENT Provider Note   CSN: [id number] Arrival date & time: 02/22/17  0137     History   Chief Complaint Chief Complaint  Patient presents with  . Back Pain    HPI Stacy Olson is a 41 y.o. female.  Patient is a 41 year old female with no significant past medical history presenting for evaluation of back pain.  She reports she was on her hands and knees leaned over the tub washing her hair.  When she stood up, she felt a pull in her low back with associated severe pain.  This radiates into her buttocks.  There is no weakness or numbness in her legs.  There are no bowel or bladder complaints.  She is able to ambulate, however with significant pain.   The history is provided by the patient.  Back Pain   This is a new problem. The current episode started 3 to 5 hours ago. The problem occurs constantly. The problem has not changed since onset.The pain is associated with no known injury. The pain is present in the lumbar spine. The quality of the pain is described as stabbing. The pain does not radiate. The pain is severe. The pain is the same all the time. Pertinent negatives include no numbness, no bowel incontinence, no bladder incontinence, no paresis, no tingling and no weakness. Treatments tried: Tylenol. The treatment provided no relief.    Past Medical History:  Diagnosis Date  . Anemia   . Urethrocele     Patient Active Problem List   Diagnosis Date Noted  . Absolute anemia     Past Surgical History:  Procedure Laterality Date  . CESAREAN SECTION  12/10/2011   Procedure: CESAREAN SECTION;  Surgeon: Cyril Mourning, MD;  Location: [facility];  Service: Obstetrics;  Laterality: N/A;  . COLONOSCOPY N/A 12/12/2015   Procedure: COLONOSCOPY;  Surgeon: Manus Gunning, MD;  Location: [facility];  Service: Gastroenterology;  Laterality: N/A;  . ESOPHAGOGASTRODUODENOSCOPY N/A 12/12/2015   Procedure:  ESOPHAGOGASTRODUODENOSCOPY (EGD);  Surgeon: Manus Gunning, MD;  Location: [facility];  Service: Gastroenterology;  Laterality: N/A;    OB History    Gravida Para Term Preterm AB Living   3 3 3     3    SAB TAB Ectopic Multiple Live Births           2       Home Medications    Prior to Admission medications   Medication Sig Start Date End Date Taking? Authorizing Provider  amoxicillin-clavulanate (AUGMENTIN) 875-125 MG tablet Take 1 tablet by mouth 2 (two) times daily. Patient not taking: Reported on 06/10/2016 02/26/16   Shelly Bombard, MD  bismuth-metronidazole-tetracycline [facility] [phone] MG capsule Take 3 capsules by mouth 4 (four) times daily -  before meals and at bedtime. Patient not taking: Reported on 02/26/2016 12/13/15   Yetta Flock, MD  ciprofloxacin (CIPRO) 500 MG tablet Take 1 tablet (500 mg total) by mouth 2 (two) times daily. Patient not taking: Reported on 02/26/2016 12/10/15   Shelly Bombard, MD  ferrous sulfate 325 (65 FE) MG tablet Take 1 tablet (325 mg total) by mouth daily with breakfast. 12/14/11 12/12/15  Everlene Farrier, MD  iron polysaccharides (NIFEREX) 150 MG capsule Take by mouth.    [provider]  metroNIDAZOLE (FLAGYL) 500 MG tablet Take 1 tablet (500 mg total) by mouth 2 (two) times daily. Patient not taking: Reported on 02/26/2016 12/10/15   Shelly Bombard, MD  Norethin Ace-Eth Estrad-FE (TAYTULLA) 1-20 MG-MCG(24) CAPS Take 1 capsule by mouth daily. 02/26/16   Shelly Bombard, MD  Norethin Ace-Eth Estrad-FE 1-20 MG-MCG(24) CAPS Take by mouth. 02/26/16   [provider]  omeprazole (PRILOSEC) 40 MG capsule Take 1 capsule (40 mg total) by mouth daily. Patient not taking: Reported on 06/10/2016 11/07/15   Yetta Flock, MD  ranitidine (ZANTAC) 150 MG capsule  04/28/16   [provider]    Family History Family History  Problem Relation Age of Onset  . Diabetes Mother   . Diabetes Father   .  Hypertension Father   . Kidney failure Father   . Anesthesia problems Neg Hx   . Hypotension Neg Hx   . Malignant hyperthermia Neg Hx   . Pseudochol deficiency Neg Hx   . Other Neg Hx     Social History Social History   Tobacco Use  . Smoking status: Never Smoker  . Smokeless tobacco: Never Used  Substance Use Topics  . Alcohol use: No  . Drug use: No     Allergies   Bactrim [sulfamethoxazole-trimethoprim]   Review of Systems Review of Systems  Gastrointestinal: Negative for bowel incontinence.  Genitourinary: Negative for bladder incontinence.  Musculoskeletal: Positive for back pain.  Neurological: Negative for tingling, weakness and numbness.  All other systems reviewed and are negative.    Physical Exam Updated Vital Signs BP 129/79 (BP Location: Left Arm)   Pulse 75   Temp 99 F (37.2 C) (Oral)   Resp 18   Ht 5\' 6"  (1.676 m)   Wt 83.5 kg (184 lb)   SpO2 100%   BMI 29.70 kg/m   Physical Exam  Constitutional: She is oriented to person, place, and time. She appears well-developed and well-nourished. No distress.  HENT:  Head: Normocephalic and atraumatic.  Neck: Normal range of motion. Neck supple.  Musculoskeletal:  There is tenderness to palpation in the soft tissues of the lumbar region.  There is no bony tenderness or step-off.  Neurological: She is alert and oriented to person, place, and time.  Strength is 5 out of 5 in both lower extremities.  DTRs are 2+ and symmetrical in the patellar and Achilles tendons bilaterally.  She is able to ambulate, however with difficulty secondary to pain.  Skin: Skin is warm and dry. She is not diaphoretic.  Nursing note and vitals reviewed.    ED Treatments / Results  Labs (all labs ordered are listed, but only abnormal results are displayed) Labs Reviewed - No data to display  EKG  EKG Interpretation None       Radiology No results found.  Procedures Procedures (including critical care  time)  Medications Ordered in ED Medications  ketorolac (TORADOL) injection 60 mg (not administered)  oxyCODONE-acetaminophen (PERCOCET/ROXICET) 5-325 MG per tablet 2 tablet (not administered)     Initial Impression / Assessment and Plan / ED Course  I have reviewed the triage vital signs and the nursing notes.  Pertinent labs & imaging results that were available during my care of the patient were reviewed by me and considered in my medical decision making (see chart for details).  Patient presents here with complaints of low back pain that started when she stood from a squatting position.  Her symptoms are most likely the result of a muscle strain.  There are no symptoms or exam findings of nerve impingement.  Her reflexes and strength are symmetrical and she has no bowel or bladder complaints.  I see no indication for imaging studies or other workup at this time.  She is feeling better after Toradol and Percocet.  She will be discharged with naproxen and hydrocodone, to follow-up as needed if not improving in the next week.  Final Clinical Impressions(s) / ED Diagnoses   Final diagnoses:  None    ED Discharge Orders    None       Veryl Speak, MD 02/22/17 [phone]

## 2017-11-17 IMAGING — US US TRANSVAGINAL NON-OB
1 series · 15 of 25 positions shown · non-contrast
Comparison: 11/14/2011.

CLINICAL DATA: Abnormal uterine bleeding.

EXAM:
TRANSABDOMINAL AND TRANSVAGINAL ULTRASOUND OF PELVIS
TECHNIQUE: Both transabdominal and transvaginal ultrasound examinations of the
pelvis were performed. Transabdominal technique was performed for
global imaging of the pelvis including uterus, ovaries, adnexal
regions, and pelvic cul-de-sac. It was necessary to proceed with
endovaginal exam following the transabdominal exam to visualize the
uterus and ovaries.

[Series 1: us transvaginal non-ob · 53 acquisitions, 15 frames shown]
[im 1/53]
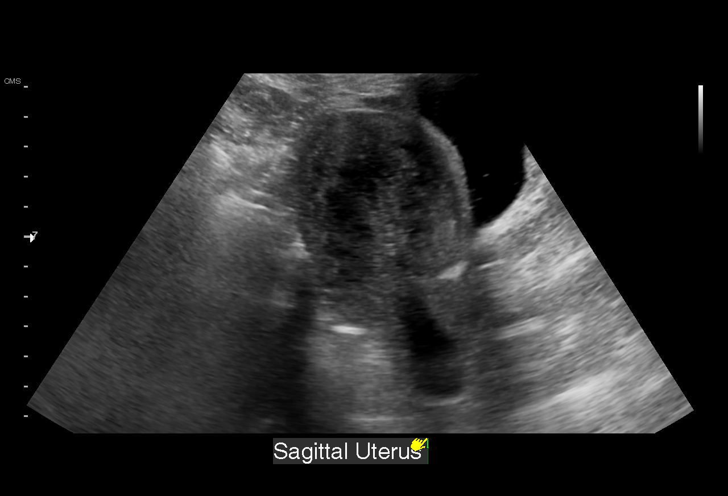
[im 5/53]
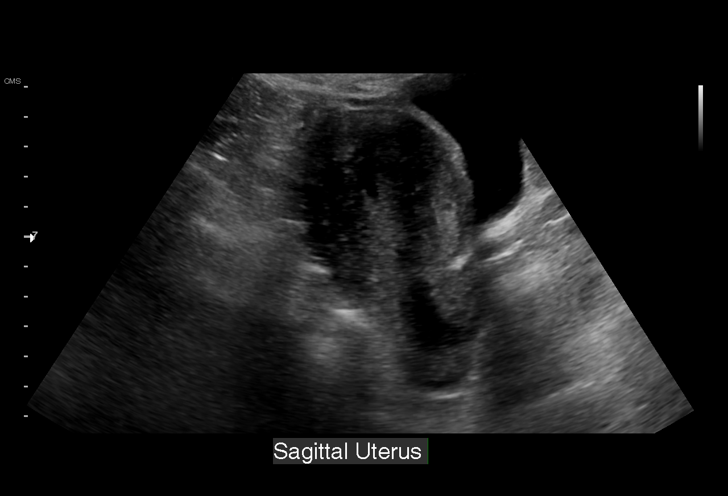
[im 9/53]
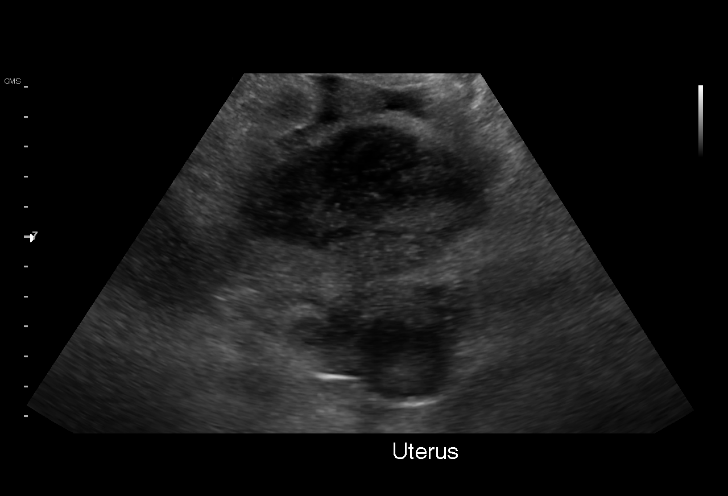
[im 11/53]
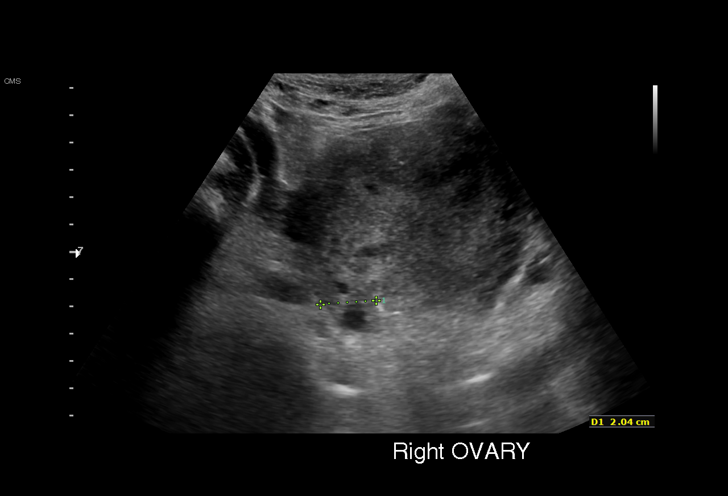
[im 16/53]
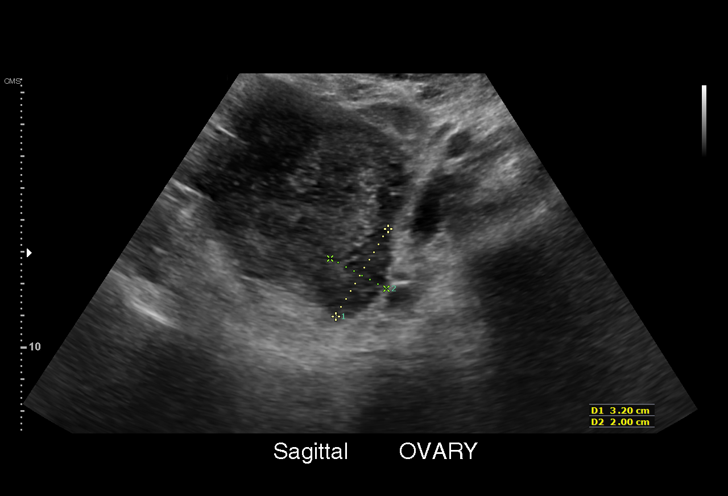
[im 20/53]
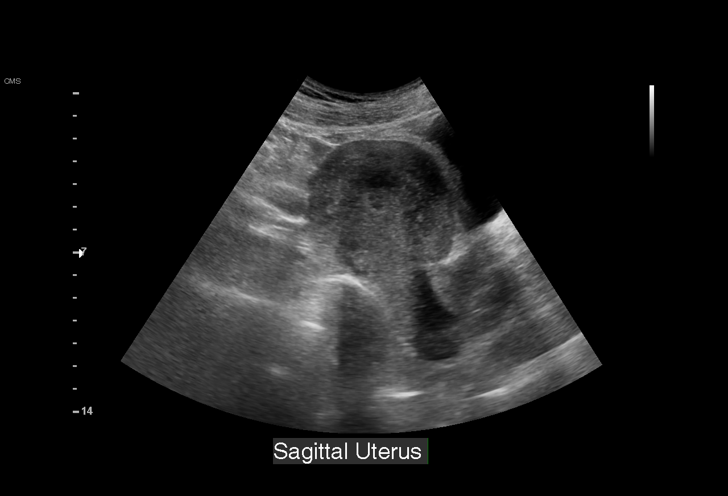
[im 22/53]
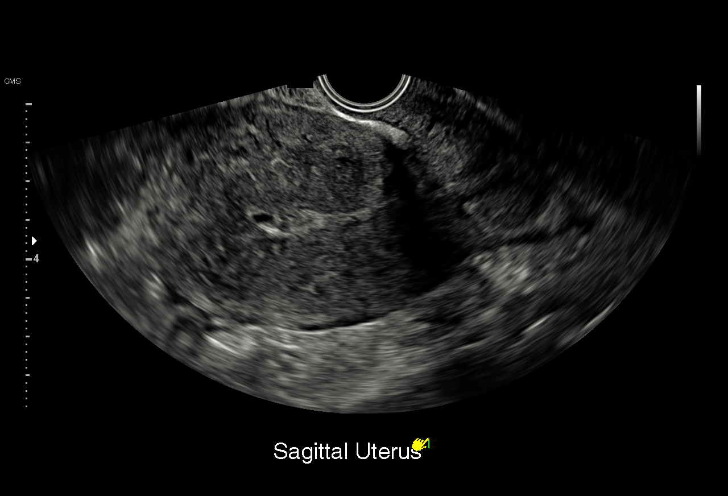
[im 27/53]
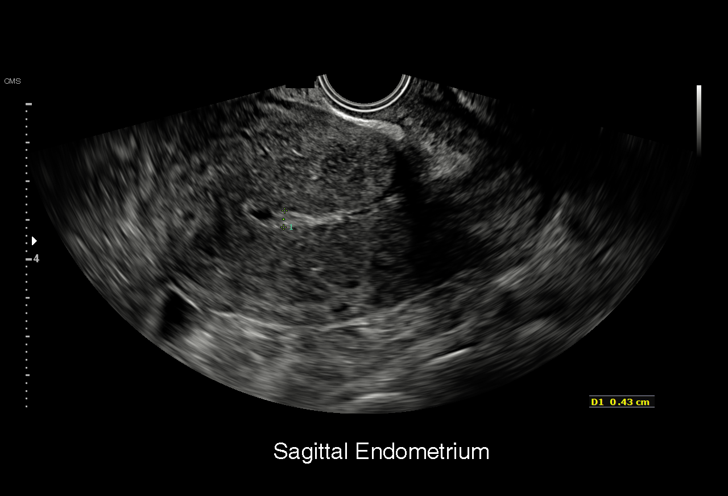
[im 31/53]
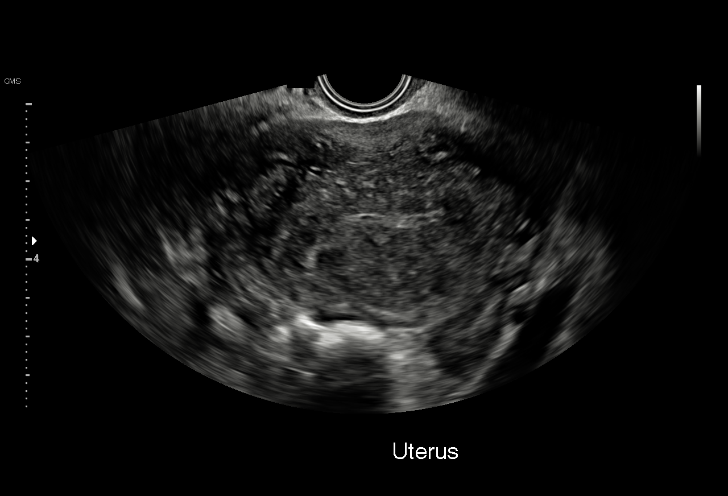
[im 33/53]
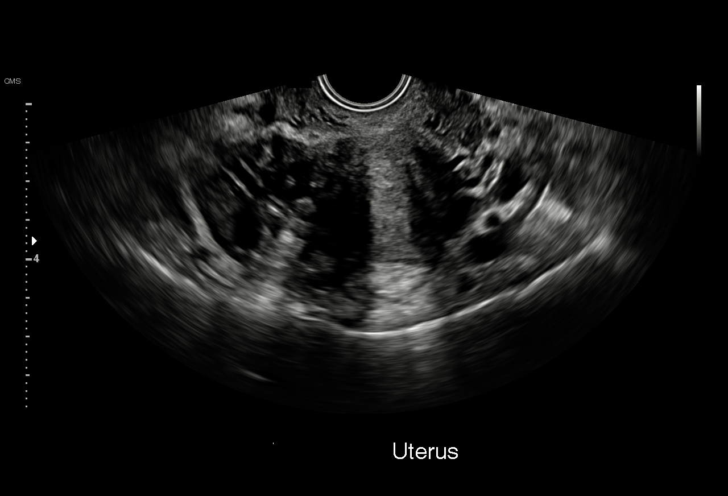
[im 37/53]
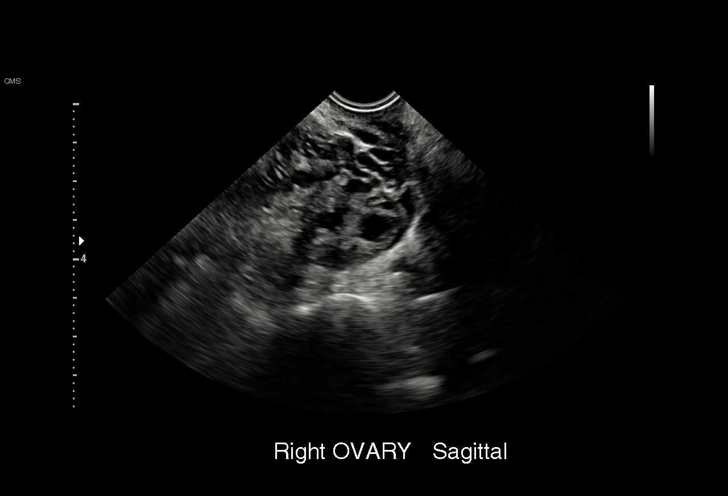
[im 42/53]
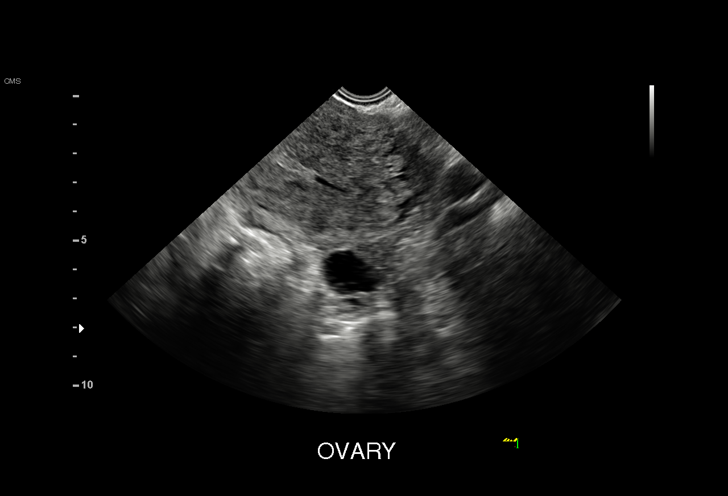
[im 44/53]
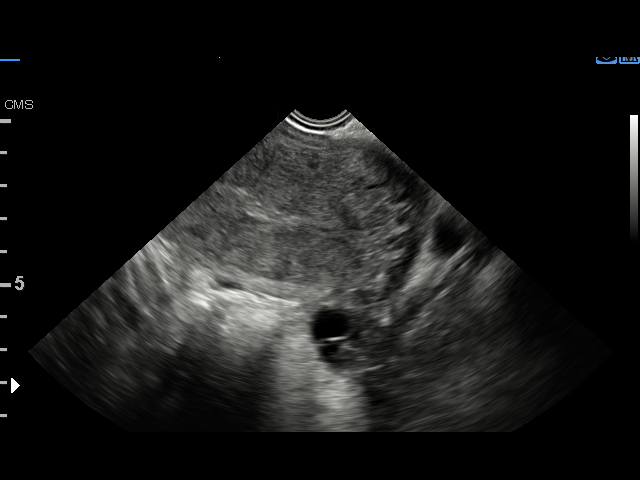
[im 48/53]
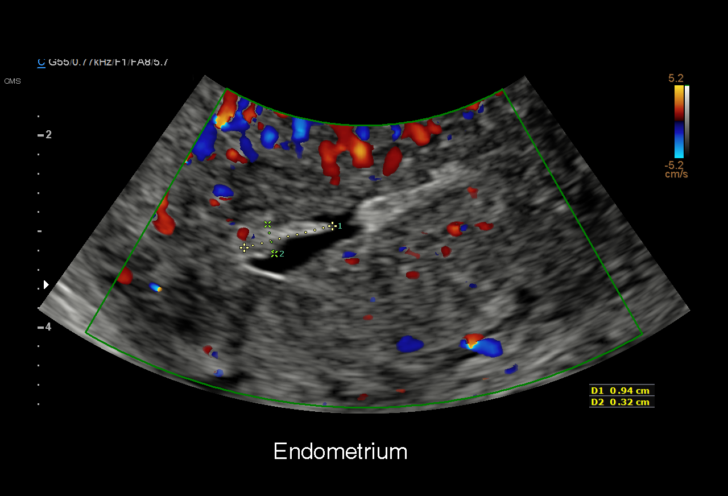
[im 53/53]
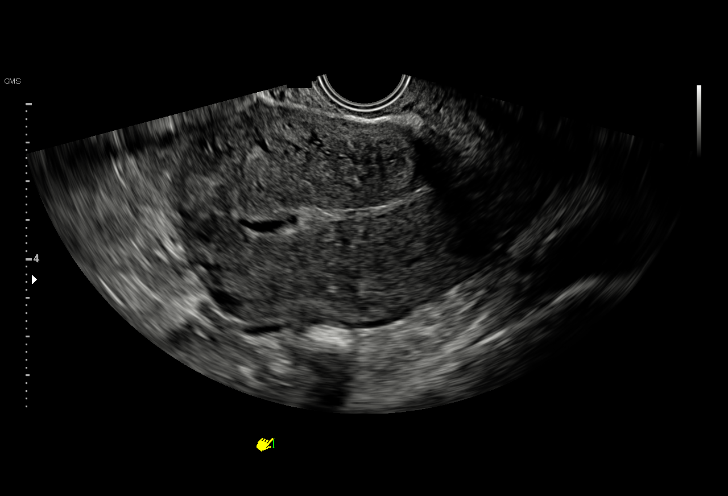

[15 of 25 positions shown; findings below may reference images not displayed]

FINDINGS: Uterus

Measurements: 9.9 x 5.9 x 8.5 cm. No fibroids or other mass
visualized.

Endometrium

Thickness: 4.3 mm. A 2.2 x 0.9 x 0.3 cm heterogeneous density is
noted within the endometrial canal. This could represent a a focal
endometrial lesion including tumor. Adherent clot could also present
in this fashion. Small amount of adjacent endometrial canal fluid is
present.

Right ovary

Measurements: 3.1 x 1.7 x 1.7 cm. Normal appearance/no adnexal mass.

Left ovary

Measurements: 5.6 x 2.5 x 2.9 cm. Normal appearance/no adnexal mass.

Other findings

Trace free fluid.
IMPRESSION: 1. A 2.2 x 0.9 x 0.3 cm heterogeneous density is noted within the
endometrial canal. This could represent a focal endometrial lesion
including tumor. Adherent clot could also present in this fashion.
Small amount of adjacent endometrial canal fluid is noted . Consider
further evaluation with sonohysterogram for confirmation prior to
hysteroscopy. Endometrial sampling should also be considered if
patient is at high risk for endometrial carcinoma. (Ref:
Radiological Reasoning: Algorithmic Workup of Abnormal Vaginal
Bleeding with Endovaginal Sonography and Sonohysterography. AJR
2337; [id number])

2. Trace free pelvic fluid .

## 2019-01-10 ENCOUNTER — Ambulatory Visit: Payer: BLUE CROSS/BLUE SHIELD | Admitting: Advanced Practice Midwife

## 2019-01-24 ENCOUNTER — Ambulatory Visit (INDEPENDENT_AMBULATORY_CARE_PROVIDER_SITE_OTHER): Payer: BC Managed Care – PPO | Admitting: Obstetrics

## 2019-01-24 ENCOUNTER — Other Ambulatory Visit: Payer: Self-pay

## 2019-01-24 ENCOUNTER — Encounter: Payer: Self-pay | Admitting: Obstetrics

## 2019-01-24 VITALS — BP 115/79 | HR 83 | Wt 192.0 lb

## 2019-01-24 DIAGNOSIS — Z113 Encounter for screening for infections with a predominantly sexual mode of transmission: Secondary | ICD-10-CM | POA: Diagnosis not present

## 2019-01-24 DIAGNOSIS — N898 Other specified noninflammatory disorders of vagina: Secondary | ICD-10-CM | POA: Diagnosis not present

## 2019-01-24 DIAGNOSIS — Z1151 Encounter for screening for human papillomavirus (HPV): Secondary | ICD-10-CM | POA: Diagnosis not present

## 2019-01-24 DIAGNOSIS — Z1239 Encounter for other screening for malignant neoplasm of breast: Secondary | ICD-10-CM

## 2019-01-24 DIAGNOSIS — Z3169 Encounter for other general counseling and advice on procreation: Secondary | ICD-10-CM

## 2019-01-24 DIAGNOSIS — N946 Dysmenorrhea, unspecified: Secondary | ICD-10-CM

## 2019-01-24 DIAGNOSIS — N939 Abnormal uterine and vaginal bleeding, unspecified: Secondary | ICD-10-CM

## 2019-01-24 DIAGNOSIS — Z124 Encounter for screening for malignant neoplasm of cervix: Secondary | ICD-10-CM

## 2019-01-24 DIAGNOSIS — Z01419 Encounter for gynecological examination (general) (routine) without abnormal findings: Secondary | ICD-10-CM

## 2019-01-24 MED ORDER — IBUPROFEN 800 MG PO TABS: 800.0000 mg | ORAL_TABLET | Freq: Three times a day (TID) | ORAL | 5 refills | Status: AC | PRN

## 2019-01-24 MED ORDER — PRENATAL PLUS 27-1 MG PO TABS: 1.0000 | ORAL_TABLET | Freq: Every day | ORAL | 11 refills | Status: AC

## 2019-01-24 NOTE — Progress Notes (Signed)
Subjective:        Stacy Olson is a 43 y.o. female here for a routine exam.  Current complaints: Heavy, painful and prolonged periods ~ 7days.  Denies any intermenstrual  Bleeding.  Had an U/S in 2017 that showed a questionable Endometrial Polyp, but she never returned for follow up.  Personal health questionnaire:  Is patient Ashkenazi Jewish, have a family history of breast and/or ovarian cancer: no Is there a family history of uterine cancer diagnosed at age < 85, gastrointestinal cancer, urinary tract cancer, family member who is a Field seismologist syndrome-associated carrier: no Is the patient overweight and hypertensive, family history of diabetes, personal history of gestational diabetes, preeclampsia or PCOS: no Is patient over 70, have PCOS,  family history of premature CHD under age 61, diabetes, smoke, have hypertension or peripheral artery disease:  no At any time, has a partner hit, kicked or otherwise hurt or frightened you?: no Over the past 2 weeks, have you felt down, depressed or hopeless?: no Over the past 2 weeks, have you felt little interest or pleasure in doing things?:no   Gynecologic History Patient's last menstrual period was 12/27/2018 (approximate). Contraception: none Last Pap: 2017. Results were: ASCUS Last mammogram: none. Results were: none  Obstetric History OB History  Gravida Para Term Preterm AB Living  3 3 3     3   SAB TAB Ectopic Multiple Live Births          2    # Outcome Date GA Lbr Len/2nd Weight Sex Delivery Anes PTL Lv  3 Term 12/10/11 [redacted]w[redacted]d  8 lb 9.6 oz (3.9 kg) F CS-LTranv Spinal  LIV  2 Term 06/2009     Vag-Spont   LIV  1 Term 10/2007     Vag-Spont  N     Past Medical History:  Diagnosis Date  . Anemia   . Urethrocele     Past Surgical History:  Procedure Laterality Date  . CESAREAN SECTION  12/10/2011   Procedure: CESAREAN SECTION;  Surgeon: Cyril Mourning, MD;  Location: [facility];  Service: Obstetrics;  Laterality: N/A;   . COLONOSCOPY N/A 12/12/2015   Procedure: COLONOSCOPY;  Surgeon: Manus Gunning, MD;  Location: [facility];  Service: Gastroenterology;  Laterality: N/A;  . ESOPHAGOGASTRODUODENOSCOPY N/A 12/12/2015   Procedure: ESOPHAGOGASTRODUODENOSCOPY (EGD);  Surgeon: Manus Gunning, MD;  Location: [facility];  Service: Gastroenterology;  Laterality: N/A;     Current Outpatient Medications:  .  amoxicillin-clavulanate (AUGMENTIN) 875-125 MG tablet, Take 1 tablet by mouth 2 (two) times daily. (Patient not taking: Reported on 06/10/2016), Disp: 28 tablet, Rfl: 1 .  bismuth-metronidazole-tetracycline (PYLERA) 140-125-125 MG capsule, Take 3 capsules by mouth 4 (four) times daily -  before meals and at bedtime. (Patient not taking: Reported on 02/26/2016), Disp: 120 capsule, Rfl: 0 .  ciprofloxacin (CIPRO) 500 MG tablet, Take 1 tablet (500 mg total) by mouth 2 (two) times daily. (Patient not taking: Reported on 02/26/2016), Disp: 14 tablet, Rfl: 0 .  ferrous sulfate 325 (65 FE) MG tablet, Take 1 tablet (325 mg total) by mouth daily with breakfast., Disp: 30 tablet, Rfl: 2 .  HYDROcodone-acetaminophen (NORCO) 5-325 MG tablet, Take 1-2 tablets by mouth every 6 (six) hours as needed. (Patient not taking: Reported on 01/24/2019), Disp: 15 tablet, Rfl: 0 .  ibuprofen (ADVIL) 800 MG tablet, Take 1 tablet (800 mg total) by mouth every 8 (eight) hours as needed., Disp: 30 tablet, Rfl: 5 .  iron polysaccharides (NIFEREX) 150 MG  capsule, Take by mouth., Disp: , Rfl:  .  metroNIDAZOLE (FLAGYL) 500 MG tablet, Take 1 tablet (500 mg total) by mouth 2 (two) times daily. (Patient not taking: Reported on 02/26/2016), Disp: 14 tablet, Rfl: 2 .  naproxen (NAPROSYN) 500 MG tablet, Take 1 tablet (500 mg total) by mouth 2 (two) times daily. (Patient not taking: Reported on 01/24/2019), Disp: 20 tablet, Rfl: 0 .  Norethin Ace-Eth Estrad-FE (TAYTULLA) 1-20 MG-MCG(24) CAPS, Take 1 capsule by mouth daily. (Patient not  taking: Reported on 01/24/2019), Disp: 28 capsule, Rfl: prn .  Norethin Ace-Eth Estrad-FE 1-20 MG-MCG(24) CAPS, Take by mouth., Disp: , Rfl:  .  omeprazole (PRILOSEC) 40 MG capsule, Take 1 capsule (40 mg total) by mouth daily. (Patient not taking: Reported on 06/10/2016), Disp: 30 capsule, Rfl: 3 .  prenatal vitamin w/FE, FA (PRENATAL 1 + 1) 27-1 MG TABS tablet, Take 1 tablet by mouth daily before breakfast., Disp: 30 tablet, Rfl: 11 .  ranitidine (ZANTAC) 150 MG capsule, , Disp: , Rfl:  Allergies  Allergen Reactions  . Bactrim [Sulfamethoxazole-Trimethoprim]     Social History   Tobacco Use  . Smoking status: Never Smoker  . Smokeless tobacco: Never Used  Substance Use Topics  . Alcohol use: No    Family History  Problem Relation Age of Onset  . Diabetes Mother   . Diabetes Father   . Hypertension Father   . Kidney failure Father   . Anesthesia problems Neg Hx   . Hypotension Neg Hx   . Malignant hyperthermia Neg Hx   . Pseudochol deficiency Neg Hx   . Other Neg Hx       Review of Systems  Constitutional: negative for fatigue and weight loss Respiratory: negative for cough and wheezing Cardiovascular: negative for chest pain, fatigue and palpitations Gastrointestinal: negative for abdominal pain and change in bowel habits Musculoskeletal:negative for myalgias Neurological: negative for gait problems and tremors Behavioral/Psych: negative for abusive relationship, depression Endocrine: negative for temperature intolerance    Genitourinary:Positive for abnormal menstrual periods.  Negative for genital lesions, hot flashes, sexual problems and vaginal discharge Integument/breast: negative for breast lump, breast tenderness, nipple discharge and skin lesion(s)    Objective:       BP 115/79   Pulse 83   Wt 192 lb (87.1 kg)   LMP 12/27/2018 (Approximate)   BMI 30.99 kg/m  General:   alert  Skin:   no rash or abnormalities  Lungs:   clear to auscultation bilaterally   Heart:   regular rate and rhythm, S1, S2 normal, no murmur, click, rub or gallop  Breasts:   normal without suspicious masses, skin or nipple changes or axillary nodes  Abdomen:  normal findings: no organomegaly, soft, non-tender and no hernia  Pelvis:  External genitalia: normal general appearance Urinary system: urethral meatus normal and bladder without fullness, nontender Vaginal: normal without tenderness, induration or masses Cervix: normal appearance Adnexa: normal bimanual exam Uterus: anteverted and non-tender, normal size   Lab Review Urine pregnancy test Labs reviewed yes Radiologic studies reviewed yes  50% of 25 min visit spent on counseling and coordination of care.   Assessment:     1. Encounter for routine gynecological examination with Papanicolaou smear of cervix Rx: - Cytology - PAP( [place])  2. Abnormal uterine bleeding (AUB) Rx: - US PELVIC COMPLETE WITH TRANSVAGINAL; Future  3. Vaginal discharge Rx: - Cervicovaginal ancillary only( [place])  4. Dysmenorrhea Rx: - ibuprofen (ADVIL) 800 MG tablet; Take 1 tablet (  800 mg total) by mouth every 8 (eight) hours as needed.  Dispense: 30 tablet; Refill: 5  5. Screen for STD (sexually transmitted disease) Rx: - Hepatitis B surface antigen - Hepatitis C antibody - HIV Antibody (routine testing w rflx) - RPR  6. Encounter for preconception consultation Rx: - prenatal vitamin w/FE, FA (PRENATAL 1 + 1) 27-1 MG TABS tablet; Take 1 tablet by mouth daily before breakfast.  Dispense: 30 tablet; Refill: 11  7. Screening breast examination Rx: - MM 3D SCREEN BREAST BILATERAL; Future    Plan:    Education reviewed: calcium supplements, depression evaluation, low fat, low cholesterol diet, safe sex/STD prevention, self breast exams and weight bearing exercise. Contraception: none. Mammogram ordered. Follow up in: 2 weeks.   Meds ordered this encounter  Medications  . prenatal vitamin w/FE, FA  (PRENATAL 1 + 1) 27-1 MG TABS tablet    Sig: Take 1 tablet by mouth daily before breakfast.    Dispense:  30 tablet    Refill:  11  . ibuprofen (ADVIL) 800 MG tablet    Sig: Take 1 tablet (800 mg total) by mouth every 8 (eight) hours as needed.    Dispense:  30 tablet    Refill:  5   Orders Placed This Encounter  Procedures  . US PELVIC COMPLETE WITH TRANSVAGINAL    Standing Status:   Future    Standing Expiration Date:   03/25/2020    Order Specific Question:   Reason for Exam (SYMPTOM  OR DIAGNOSIS REQUIRED)    Answer:   AUB.  Follow up endometrial polyp.    Order Specific Question:   Preferred imaging location?    Answer:   [facility]  . MM 3D SCREEN BREAST BILATERAL    INS BCBS NOT WAKE PF NONE NO PROBLEMS / NO HX OF BR CA / NO IMPLANTS / TOMO / NO NEEDS / COVID Q'S - NONE/ WEAR MASK AND COME ALONE / SWP JR    Standing Status:   Future    Standing Expiration Date:   03/25/2020    Order Specific Question:   Reason for Exam (SYMPTOM  OR DIAGNOSIS REQUIRED)    Answer:   VISIT FOR SCREENING MAMMOGRAM    Order Specific Question:   Is the patient pregnant?    Answer:   No    Order Specific Question:   Preferred imaging location?    Answer:   [facility]  . Hepatitis B surface antigen  . Hepatitis C antibody  . HIV Antibody (routine testing w rflx)  . RPR     Shelly Bombard, MD 01/24/2019 12:08 PM

## 2019-01-24 NOTE — Progress Notes (Signed)
Pt is here for annual gyn exam. Last pap unknown. Pt is currently TTC, LMP approximately 12/27/18. Pt has not had MMG yet.

## 2019-01-25 LAB — RPR: RPR Ser Ql: NONREACTIVE

## 2019-01-25 LAB — HEPATITIS C ANTIBODY: Hep C Virus Ab: <0.1 s/co ratio (ref 0.0–0.9)

## 2019-01-25 LAB — HIV ANTIBODY (ROUTINE TESTING W REFLEX): HIV Screen 4th Generation wRfx: NONREACTIVE

## 2019-01-25 LAB — HEPATITIS B SURFACE ANTIGEN: Hepatitis B Surface Ag: NEGATIVE

## 2019-01-28 LAB — CERVICOVAGINAL ANCILLARY ONLY
Bacterial Vaginitis (gardnerella): NEGATIVE
Candida Glabrata: NEGATIVE
Candida Vaginitis: NEGATIVE
Chlamydia: NEGATIVE
Comment: NEGATIVE
Comment: NEGATIVE
Comment: NEGATIVE
Comment: NEGATIVE
Comment: NEGATIVE
Comment: NORMAL
Neisseria Gonorrhea: NEGATIVE
Trichomonas: NEGATIVE

## 2019-02-01 ENCOUNTER — Ambulatory Visit (HOSPITAL_COMMUNITY): Payer: BC Managed Care – PPO

## 2019-02-02 LAB — CYTOLOGY - PAP
Comment: NEGATIVE
Diagnosis: NEGATIVE
Diagnosis: REACTIVE
High risk HPV: NEGATIVE

## 2019-02-07 ENCOUNTER — Ambulatory Visit: Payer: BC Managed Care – PPO | Admitting: Obstetrics

## 2019-03-14 ENCOUNTER — Ambulatory Visit: Payer: BC Managed Care – PPO

## 2019-04-11 ENCOUNTER — Ambulatory Visit (HOSPITAL_COMMUNITY): Admission: RE | Admit: 2019-04-11 | Payer: BC Managed Care – PPO | Source: Ambulatory Visit

## 2022-12-04 ENCOUNTER — Encounter: Payer: Self-pay | Admitting: Gastroenterology
